# Patient Record
Sex: Female | Born: 1995
Health system: Southern US, Community
[De-identification: ages and names within clinical notes are randomized; demographics above are authoritative.]

## PROBLEM LIST (undated history)

## (undated) DIAGNOSIS — G43909 Migraine, unspecified, not intractable, without status migrainosus: Secondary | ICD-10-CM

## (undated) DIAGNOSIS — N200 Calculus of kidney: Secondary | ICD-10-CM

## (undated) DIAGNOSIS — F4320 Adjustment disorder, unspecified: Secondary | ICD-10-CM

## (undated) DIAGNOSIS — E669 Obesity, unspecified: Secondary | ICD-10-CM

## (undated) DIAGNOSIS — T7840XA Allergy, unspecified, initial encounter: Secondary | ICD-10-CM

## (undated) DIAGNOSIS — R1310 Dysphagia, unspecified: Secondary | ICD-10-CM

## (undated) HISTORY — DX: Dysphagia, unspecified: R13.10

## (undated) HISTORY — DX: Obesity, unspecified: E66.9

## (undated) HISTORY — DX: Calculus of kidney: N20.0

## (undated) HISTORY — PX: HX NO SURGICAL PROCEDURES: 2100001501

## (undated) HISTORY — DX: Allergy, unspecified, initial encounter: T78.40XA

## (undated) HISTORY — DX: Adjustment disorder, unspecified: F43.20

---
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
## 2019-08-21 ENCOUNTER — Encounter (HOSPITAL_COMMUNITY): Payer: Self-pay | Admitting: Emergency Medicine

## 2019-08-21 ENCOUNTER — Other Ambulatory Visit: Payer: Self-pay

## 2019-08-21 ENCOUNTER — Emergency Department (HOSPITAL_COMMUNITY)
Admission: EM | Admit: 2019-08-21 | Discharge: 2019-08-21 | Discharge status: Against Medical Advice | Payer: BC Managed Care – PPO | Attending: Emergency Medicine | Admitting: Emergency Medicine

## 2019-08-21 DIAGNOSIS — R519 Headache, unspecified: Secondary | ICD-10-CM | POA: Diagnosis not present

## 2019-08-21 DIAGNOSIS — G43411 Hemiplegic migraine, intractable, with status migrainosus: Secondary | ICD-10-CM | POA: Diagnosis not present

## 2019-08-21 HISTORY — DX: Migraine, unspecified, not intractable, without status migrainosus: G43.909

## 2019-08-21 LAB — CBC WITH DIFFERENTIAL/PLATELET
Abs Immature Granulocytes: 0.05 10*3/uL (ref 0.00–0.07)
Basophils Absolute: 0 10*3/uL (ref 0.0–0.1)
Basophils Relative: 0 %
Eosinophils Absolute: 0 10*3/uL (ref 0.0–0.5)
Eosinophils Relative: 0 %
HCT: 47 % — ABNORMAL HIGH (ref 36.0–46.0)
Hemoglobin: 15.9 g/dL — ABNORMAL HIGH (ref 12.0–15.0)
Immature Granulocytes: 0 %
Lymphocytes Relative: 8 %
Lymphs Abs: 1.2 10*3/uL (ref 0.7–4.0)
MCH: 28.6 pg (ref 26.0–34.0)
MCHC: 33.8 g/dL (ref 30.0–36.0)
MCV: 84.7 fL (ref 80.0–100.0)
Monocytes Absolute: 0.6 10*3/uL (ref 0.1–1.0)
Monocytes Relative: 4 %
Neutro Abs: 13.7 10*3/uL — ABNORMAL HIGH (ref 1.7–7.7)
Neutrophils Relative %: 88 %
Platelets: 291 10*3/uL (ref 150–400)
RBC: 5.55 MIL/uL — ABNORMAL HIGH (ref 3.87–5.11)
RDW: 12.1 % (ref 11.5–15.5)
WBC: 15.6 10*3/uL — ABNORMAL HIGH (ref 4.0–10.5)
nRBC: 0 % (ref 0.0–0.2)

## 2019-08-21 LAB — BASIC METABOLIC PANEL
Anion gap: 14 (ref 5–15)
BUN: 11 mg/dL (ref 6–20)
CO2: 17 mmol/L — ABNORMAL LOW (ref 22–32)
Calcium: 9.6 mg/dL (ref 8.9–10.3)
Chloride: 106 mmol/L (ref 98–111)
Creatinine, Ser: 0.75 mg/dL (ref 0.44–1.00)
GFR calc Af Amer: >60 mL/min (ref >60)
GFR calc non Af Amer: >60 mL/min (ref >60)
Glucose, Bld: 117 mg/dL — ABNORMAL HIGH (ref 70–99)
Potassium: 4 mmol/L (ref 3.5–5.1)
Sodium: 137 mmol/L (ref 135–145)

## 2019-08-21 LAB — I-STAT BETA HCG BLOOD, ED (MC, WL, AP ONLY): I-stat hCG, quantitative: <5 m[IU]/mL (ref <5)

## 2019-08-21 MED ORDER — SODIUM CHLORIDE 0.9 % IV BOLUS
1000.0000 mL | Freq: Once | INTRAVENOUS | Status: AC
  Administered 2019-08-21: 17:00:00 1000 mL via INTRAVENOUS

## 2019-08-21 MED ORDER — METOCLOPRAMIDE HCL 5 MG/ML IJ SOLN
10.0000 mg | Freq: Once | INTRAMUSCULAR | Status: AC
  Administered 2019-08-21: 10 mg via INTRAVENOUS
  Filled 2019-08-21: qty 2

## 2019-08-21 MED ORDER — DIPHENHYDRAMINE HCL 50 MG/ML IJ SOLN
25.0000 mg | Freq: Once | INTRAMUSCULAR | Status: AC
  Administered 2019-08-21: 25 mg via INTRAVENOUS
  Filled 2019-08-21: qty 1

## 2019-08-21 MED ORDER — ONDANSETRON 4 MG PO TBDP
4.0000 mg | ORAL_TABLET | Freq: Once | ORAL | Status: AC | PRN
  Administered 2019-08-21: 4 mg via ORAL
  Filled 2019-08-21: qty 1

## 2019-08-21 NOTE — ED Triage Notes (Signed)
Pt here POV with a migraine that started 0930 this morning and has had N/V since onset of HA. Pt states that she has taken MagSalt denies taking anything for the N/V.

## 2019-08-21 NOTE — Discharge Instructions (Signed)
Return for new or worsening symptoms

## 2019-08-21 NOTE — ED Provider Notes (Signed)
[facility] EMERGENCY DEPARTMENT Provider Note   CSN: [id number] Arrival date & time: 08/21/19  1606    History Chief Complaint  Patient presents with  . Migraine    Cheryl Perez is a 24 y.o. female with past medical history significant for migraine who presents for evaluation of headache. Patient states headache started at 930 this morning.  Has history of migraines however has not had 1 over the last 2 years.  She states she initially felt like she had some weakness to her right upper extremity.  She was seen in urgent care.  Patient states this has resolved however now feels like she has some weakness in her left upper extremity.  She admits to photophobia without phonophobia.  Multiple episodes of NBNB emesis.  States this feels similar to her prior migraines.  Denies recent head trauma, head injury, lightheadedness, dizziness, neck pain, neck stiffness, chest pain, shortness of breath, abdominal pain, diarrhea, dysuria.  She denies any paresthesias, difficulty with word finding.  Rates her pain a 10/10.  She denies any sudden onset thunderclap headache.  Denies eye pain.  Denies additional aggravating or alleviating factors.  History obtained from patient and past medical records. No interpretor was used.  HPI     Past Medical History:  Diagnosis Date  . Migraine     There are no problems to display for this patient.   History reviewed. No pertinent surgical history.   OB History   No obstetric history on file.     No family history on file.  Social History   Tobacco Use  . Smoking status: Not on file  Substance Use Topics  . Alcohol use: Not on file  . Drug use: Not on file    Home Medications Prior to Admission medications   Not on File    Allergies    Patient has no active allergies.  Review of Systems   Review of Systems  Constitutional: Negative.   HENT: Negative.   Eyes: Positive for photophobia. Negative for visual disturbance.   Respiratory: Negative.   Cardiovascular: Negative.   Gastrointestinal: Negative.   Genitourinary: Negative.   Musculoskeletal: Negative.   Skin: Negative.   Neurological: Positive for weakness (Initially RUE, resolved and now LUE) and headaches. Negative for dizziness, tremors, seizures, syncope, facial asymmetry, speech difficulty, light-headedness and numbness.  All other systems reviewed and are negative.   Physical Exam Updated Vital Signs BP 128/71 (BP Location: Left Arm)   Pulse 90   Temp 98 F (36.7 C) (Oral)   Resp 20   Ht 5\' 3"  (1.6 m)   Wt 99.8 kg   SpO2 100%   BMI 38.97 kg/m   Physical Exam Physical Exam  Constitutional: Pt is oriented to person, place, and time. Pt appears well-developed and well-nourished. No distress.  HENT:  Head: Normocephalic and atraumatic.  Mouth/Throat: Oropharynx is clear and moist.  Eyes: Conjunctivae and EOM are normal. Pupils are equal, round, and reactive to light. No scleral icterus.  No horizontal, vertical or rotational nystagmus  Neck: Normal range of motion. Neck supple.  Full active and passive ROM without pain No midline or paraspinal tenderness No nuchal rigidity or meningeal signs  Cardiovascular: Normal rate, regular rhythm and intact distal pulses.   Pulmonary/Chest: Effort normal and breath sounds normal. No respiratory distress. Pt has no wheezes. No rales.  Abdominal: Soft. Bowel sounds are normal. There is no tenderness. There is no rebound and no guarding.  Musculoskeletal: Normal range of  motion.  Lymphadenopathy:    No cervical adenopathy.  Neurological: Pt. is alert and oriented to person, place, and time. He has normal reflexes. No cranial nerve deficit.  Exhibits normal muscle tone. Coordination normal.  Mental Status:  Alert, oriented, thought content appropriate. Speech fluent without evidence of aphasia. Able to follow 2 step commands without difficulty.  Cranial Nerves:  II:  Peripheral visual fields  grossly normal, pupils equal, round, reactive to light III,IV, VI: ptosis not present, extra-ocular motions intact bilaterally  V,VII: smile symmetric, facial light touch sensation equal VIII: hearing grossly normal bilaterally  IX,X: midline uvula rise  XI: bilateral shoulder shrug equal and strong XII: midline tongue extension  Motor:  5/5 in upper and lower extremities bilaterally including strong and equal grip strength and dorsiflexion/plantar flexion Sensory: Pinprick and light touch normal in all extremities.  Deep Tendon Reflexes: 2+ and symmetric  Cerebellar: normal finger-to-nose with bilateral upper extremities however slow to BL finger to nose Gait: normal gait and balance CV: distal pulses palpable throughout   Skin: Skin is warm and dry. No rash noted. Pt is not diaphoretic.  Psychiatric: Pt has a normal mood and affect. Behavior is normal. Judgment and thought content normal.  Nursing note and vitals reviewed. ED Results / Procedures / Treatments   Labs (all labs ordered are listed, but only abnormal results are displayed) Labs Reviewed  CBC WITH DIFFERENTIAL/PLATELET - Abnormal; Notable for the following components:      Result Value   WBC 15.6 (*)    RBC 5.55 (*)    Hemoglobin 15.9 (*)    HCT 47.0 (*)    Neutro Abs 13.7 (*)    All other components within normal limits  BASIC METABOLIC PANEL - Abnormal; Notable for the following components:   CO2 17 (*)    Glucose, Bld 117 (*)    All other components within normal limits  I-STAT BETA HCG BLOOD, ED ([facility], [facility], [facility] ONLY)    EKG None  Radiology No results found.  Procedures Procedures (including critical care time)  Medications Ordered in ED Medications  ondansetron (ZOFRAN-ODT) disintegrating tablet 4 mg (4 mg Oral Given 08/21/19 1621)  sodium chloride 0.9 % bolus 1,000 mL (1,000 mLs Intravenous Bolus from Bag 08/21/19 1708)  metoCLOPramide (REGLAN) injection 10 mg (10 mg Intravenous Given 08/21/19 1709)   diphenhydrAMINE (BENADRYL) injection 25 mg (25 mg Intravenous Given 08/21/19 1709)   ED Course  I have reviewed the triage vital signs and the nursing notes.  Pertinent labs & imaging results that were available during my care of the patient were reviewed by me and considered in my medical decision making (see chart for details).  24 year old female presents for evaluation of migraine.  History of migraine.  She is afebrile, nonseptic, not ill-appearing.  Began at 9:30 AM this morning however denies sudden onset thunderclap headache, trauma.  Patient states initially she had some right upper extremity weakness.  Was seen by urgent care and referred here to the emergency department.  Patient states this has resolved however she now feels like she has weakness in her left upper extremity.  Multiple episodes of NBNB emesis.  She has a nonfocal neuro exam without deficits except she does have some slow finger-to-nose bilaterally.  Given subjective weakness will obtain CT head.  She has no neck stiffness or neck rigidity.  Will also give migraine cocktail.  1830: Nursing has informed me that patient is wanting to leave.  States her headache has improved.  Discussed with patient concerning symptoms at urgent care as well as here in the emergency department.  Patient states she does not want any imaging obtained.  Appears to have the capacity to make medical decisions.  Discussed risk versus benefit of leaving the emergency department.  Patient voiced understanding has chosen to leave [place].  We discussed the nature and purpose, risks and benefits, as well as, the alternatives of treatment. Time was given to allow the opportunity to ask questions and consider their options, and after the discussion, the patient decided to refuse the offerred treatment. The patient was informed that refusal could lead to, but was not limited to, death, permanent disability, or severe pain. If present, I asked the  relatives or significant others to dissuade them without success. Prior to refusing, I determined that the patient had the capacity to make their decision and understood the consequences of that decision. After refusal, I made every reasonable opportunity to treat them to the best of my ability.  The patient was notified that they may return to the emergency department at any time for further treatment.       MDM Rules/Calculators/A&P                       Final Clinical Impression(s) / ED Diagnoses Final diagnoses:  Acute nonintractable headache, unspecified headache type    Rx / DC Orders ED Discharge Orders    None       Cantrell Martus A, PA-C 08/21/19 1835    Margette Fast, MD 08/21/19 [phone]

## 2019-10-15 DIAGNOSIS — N3001 Acute cystitis with hematuria: Secondary | ICD-10-CM | POA: Diagnosis not present

## 2019-12-04 DIAGNOSIS — Z020 Encounter for examination for admission to educational institution: Secondary | ICD-10-CM | POA: Diagnosis not present

## 2019-12-04 DIAGNOSIS — Z111 Encounter for screening for respiratory tuberculosis: Secondary | ICD-10-CM | POA: Diagnosis not present

## 2020-01-19 DIAGNOSIS — R110 Nausea: Secondary | ICD-10-CM | POA: Diagnosis not present

## 2020-01-19 DIAGNOSIS — R1110 Vomiting, unspecified: Secondary | ICD-10-CM | POA: Diagnosis not present

## 2020-01-19 DIAGNOSIS — R519 Headache, unspecified: Secondary | ICD-10-CM | POA: Diagnosis not present

## 2020-01-19 DIAGNOSIS — Z20822 Contact with and (suspected) exposure to covid-19: Secondary | ICD-10-CM | POA: Diagnosis not present

## 2020-03-16 DIAGNOSIS — B9689 Other specified bacterial agents as the cause of diseases classified elsewhere: Secondary | ICD-10-CM | POA: Diagnosis not present

## 2020-03-16 DIAGNOSIS — J029 Acute pharyngitis, unspecified: Secondary | ICD-10-CM | POA: Diagnosis not present

## 2020-03-16 DIAGNOSIS — J208 Acute bronchitis due to other specified organisms: Secondary | ICD-10-CM | POA: Diagnosis not present

## 2020-03-16 DIAGNOSIS — R059 Cough, unspecified: Secondary | ICD-10-CM | POA: Diagnosis not present

## 2020-05-05 DIAGNOSIS — R109 Unspecified abdominal pain: Secondary | ICD-10-CM | POA: Diagnosis not present

## 2020-05-05 DIAGNOSIS — R399 Unspecified symptoms and signs involving the genitourinary system: Secondary | ICD-10-CM | POA: Diagnosis not present

## 2020-05-06 ENCOUNTER — Other Ambulatory Visit: Payer: Self-pay | Admitting: Family Medicine

## 2020-05-06 DIAGNOSIS — R1084 Generalized abdominal pain: Secondary | ICD-10-CM

## 2020-05-14 ENCOUNTER — Encounter (HOSPITAL_COMMUNITY): Payer: Self-pay

## 2020-05-14 ENCOUNTER — Emergency Department (HOSPITAL_COMMUNITY)
Admission: EM | Admit: 2020-05-14 | Discharge: 2020-05-15 | Disposition: A | Discharge status: Home / Self Care | Payer: BC Managed Care – PPO | Attending: Emergency Medicine | Admitting: Emergency Medicine

## 2020-05-14 ENCOUNTER — Other Ambulatory Visit: Payer: Self-pay

## 2020-05-14 DIAGNOSIS — R109 Unspecified abdominal pain: Secondary | ICD-10-CM

## 2020-05-14 DIAGNOSIS — R1032 Left lower quadrant pain: Secondary | ICD-10-CM | POA: Insufficient documentation

## 2020-05-14 DIAGNOSIS — R112 Nausea with vomiting, unspecified: Secondary | ICD-10-CM | POA: Insufficient documentation

## 2020-05-14 DIAGNOSIS — R1011 Right upper quadrant pain: Secondary | ICD-10-CM | POA: Insufficient documentation

## 2020-05-14 DIAGNOSIS — M545 Low back pain, unspecified: Secondary | ICD-10-CM | POA: Diagnosis not present

## 2020-05-14 DIAGNOSIS — R1031 Right lower quadrant pain: Secondary | ICD-10-CM | POA: Diagnosis not present

## 2020-05-14 DIAGNOSIS — N939 Abnormal uterine and vaginal bleeding, unspecified: Secondary | ICD-10-CM | POA: Insufficient documentation

## 2020-05-14 DIAGNOSIS — R100 Acute abdomen: Secondary | ICD-10-CM | POA: Diagnosis not present

## 2020-05-14 LAB — I-STAT BETA HCG BLOOD, ED (MC, WL, AP ONLY): I-stat hCG, quantitative: <5 m[IU]/mL (ref <5)

## 2020-05-14 LAB — CBC
HCT: 44.7 % (ref 36.0–46.0)
Hemoglobin: 15.3 g/dL — ABNORMAL HIGH (ref 12.0–15.0)
MCH: 29 pg (ref 26.0–34.0)
MCHC: 34.2 g/dL (ref 30.0–36.0)
MCV: 84.7 fL (ref 80.0–100.0)
Platelets: 274 10*3/uL (ref 150–400)
RBC: 5.28 MIL/uL — ABNORMAL HIGH (ref 3.87–5.11)
RDW: 12.2 % (ref 11.5–15.5)
WBC: 7.4 10*3/uL (ref 4.0–10.5)
nRBC: 0 % (ref 0.0–0.2)

## 2020-05-14 LAB — COMPREHENSIVE METABOLIC PANEL
ALT: 27 U/L (ref 0–44)
AST: 18 U/L (ref 15–41)
Albumin: 4.4 g/dL (ref 3.5–5.0)
Alkaline Phosphatase: 54 U/L (ref 38–126)
Anion gap: 10 (ref 5–15)
BUN: 11 mg/dL (ref 6–20)
CO2: 24 mmol/L (ref 22–32)
Calcium: 9.5 mg/dL (ref 8.9–10.3)
Chloride: 106 mmol/L (ref 98–111)
Creatinine, Ser: 0.89 mg/dL (ref 0.44–1.00)
GFR, Estimated: >60 mL/min (ref >60)
Glucose, Bld: 90 mg/dL (ref 70–99)
Potassium: 4 mmol/L (ref 3.5–5.1)
Sodium: 140 mmol/L (ref 135–145)
Total Bilirubin: 0.7 mg/dL (ref 0.3–1.2)
Total Protein: 7.2 g/dL (ref 6.5–8.1)

## 2020-05-14 LAB — URINALYSIS, ROUTINE W REFLEX MICROSCOPIC
Bilirubin Urine: NEGATIVE
Glucose, UA: NEGATIVE mg/dL
Ketones, ur: NEGATIVE mg/dL
Nitrite: NEGATIVE
Protein, ur: NEGATIVE mg/dL
Specific Gravity, Urine: 1.024 (ref 1.005–1.030)
pH: 5 (ref 5.0–8.0)

## 2020-05-14 LAB — LIPASE, BLOOD: Lipase: 54 U/L — ABNORMAL HIGH (ref 11–51)

## 2020-05-14 NOTE — ED Triage Notes (Signed)
Pt came in c/o of abd pain. Pt started having sudden lower back pain and abd pain that started after she woke up from her nap. Pt went to [facility] and was seen there but referred here to be seen. Pt states she has been having vaginal bleeding for 3 days, with last menstruation 2 weeks ago. Pt states she was negative on her pregnancy test last week.

## 2020-05-15 ENCOUNTER — Emergency Department (HOSPITAL_COMMUNITY): Payer: BC Managed Care – PPO

## 2020-05-15 LAB — WET PREP, GENITAL
Clue Cells Wet Prep HPF POC: NONE SEEN
Sperm: NONE SEEN
Trich, Wet Prep: NONE SEEN
Yeast Wet Prep HPF POC: NONE SEEN

## 2020-05-15 MED ORDER — IOHEXOL 300 MG/ML  SOLN
100.0000 mL | Freq: Once | INTRAMUSCULAR | Status: AC | PRN
  Administered 2020-05-15: 100 mL via INTRAVENOUS

## 2020-05-15 MED ORDER — ONDANSETRON HCL 4 MG/2ML IJ SOLN
4.0000 mg | Freq: Once | INTRAMUSCULAR | Status: AC
  Administered 2020-05-15: 4 mg via INTRAVENOUS
  Filled 2020-05-15: qty 2

## 2020-05-15 MED ORDER — SODIUM CHLORIDE 0.9 % IV BOLUS
1000.0000 mL | Freq: Once | INTRAVENOUS | Status: AC
  Administered 2020-05-15: 1000 mL via INTRAVENOUS

## 2020-05-15 MED ORDER — MORPHINE SULFATE (PF) 2 MG/ML IV SOLN
2.0000 mg | Freq: Once | INTRAVENOUS | Status: AC
  Administered 2020-05-15: 2 mg via INTRAVENOUS
  Filled 2020-05-15: qty 1

## 2020-05-15 NOTE — ED Provider Notes (Signed)
[facility] EMERGENCY DEPARTMENT Provider Note   CSN: [id number] Arrival date & time: 05/14/20  2051     History Chief Complaint  Patient presents with  . Abdominal Pain    Cheryl Perez is a 24 y.o. female.  HPI   Patient is a 24 year old female with a medical history as noted below.  She presents today due to abdominal pain.  She states her symptoms started about 1 week ago.  Waxing and waning.  She was initially evaluated by her PCP and given Zofran to use as needed for nausea and vomiting.  She has been using this with minimal relief and notes current nausea and vomiting.  She states that her symptoms typically worsen with food.  She has been able to eat soft foods such as mashed potatoes but otherwise cannot tolerate most foods and will then vomit.  Additionally notes some mild vaginal bleeding about 3 days ago.  Her last menstrual cycle was 2 weeks ago.  She feels that the bleeding has decreased and now only notices it when urinating as well as when wiping after urinating.  She is sexually active with one female partner.  She states that they always use condoms for protection.  No vaginal discharge.  No dysuria.  No chest pain, shortness of breath, leg swelling.  Patient states that she was initially evaluated by her primary care provider, Dr. Hyman Hopes.  Ultrasound was scheduled for December 20.  States that she has been dealing with this type of abdominal pain for many years.  Typically not this severe.  Has never been seen by a gastroenterologist.     Past Medical History:  Diagnosis Date  . Migraine     There are no problems to display for this patient.   History reviewed. No pertinent surgical history.   OB History   No obstetric history on file.     No family history on file.     Home Medications Prior to Admission medications   Not on File    Allergies    Patient has no active allergies.  Review of Systems   Review of Systems  All other systems  reviewed and are negative. Ten systems reviewed and are negative for acute change, except as noted in the HPI.   Physical Exam Updated Vital Signs BP 116/67 (BP Location: Right Arm)   Pulse 68   Temp 98 F (36.7 C)   Resp 17   Ht 5\' 3"  (1.6 m)   Wt 109.8 kg   SpO2 100%   BMI 42.87 kg/m   Physical Exam Vitals and nursing note reviewed.  Constitutional:      General: She is not in acute distress.    Appearance: Normal appearance. She is well-developed. She is not ill-appearing, toxic-appearing or diaphoretic.  HENT:     Head: Normocephalic and atraumatic.     Right Ear: External ear normal.     Left Ear: External ear normal.     Nose: Nose normal.     Mouth/Throat:     Pharynx: Oropharynx is clear. No oropharyngeal exudate or posterior oropharyngeal erythema.     Comments: Dry mucous membranes Eyes:     Extraocular Movements: Extraocular movements intact.  Cardiovascular:     Rate and Rhythm: Normal rate and regular rhythm.     Pulses: Normal pulses.     Heart sounds: Normal heart sounds. No murmur heard. No friction rub. No gallop.   Pulmonary:     Effort: Pulmonary effort  is normal. No respiratory distress.     Breath sounds: Normal breath sounds. No stridor. No wheezing, rhonchi or rales.  Abdominal:     General: Abdomen is flat and protuberant. There is no distension.     Palpations: Abdomen is soft.     Tenderness: There is generalized abdominal tenderness.     Comments: Protuberant abdomen that is soft.  Generalized abdominal pain with deep palpation that seems to be worst in the right upper quadrant as well as left lower quadrant.  Genitourinary:    Comments: Normal-appearing vulvar anatomy.  Normal-appearing vaginal mucosa.  Closed cervical os.  Small amount of brown discharge noted in the vaginal vault consistent with likely aged blood.  No foreign bodies or increased friability.  No cervical motion tenderness.  No adnexal tenderness.  Female nursing chaperone  present throughout the exam. Musculoskeletal:        General: Normal range of motion.     Cervical back: Normal range of motion and neck supple. No tenderness.  Skin:    General: Skin is warm and dry.  Neurological:     General: No focal deficit present.     Mental Status: She is alert and oriented to person, place, and time.  Psychiatric:        Mood and Affect: Mood normal.        Behavior: Behavior normal.    ED Results / Procedures / Treatments   Labs (all labs ordered are listed, but only abnormal results are displayed) Labs Reviewed  WET PREP, GENITAL - Abnormal; Notable for the following components:      Result Value   WBC, Wet Prep HPF POC MANY (*)    All other components within normal limits  LIPASE, BLOOD - Abnormal; Notable for the following components:   Lipase 54 (*)    All other components within normal limits  CBC - Abnormal; Notable for the following components:   RBC 5.28 (*)    Hemoglobin 15.3 (*)    All other components within normal limits  URINALYSIS, ROUTINE W REFLEX MICROSCOPIC - Abnormal; Notable for the following components:   APPearance HAZY (*)    Hgb urine dipstick LARGE (*)    Leukocytes,Ua TRACE (*)    Bacteria, UA RARE (*)    All other components within normal limits  COMPREHENSIVE METABOLIC PANEL  I-STAT BETA HCG BLOOD, ED ([facility], [facility], [facility] ONLY)  GC/CHLAMYDIA PROBE AMP ([place]) NOT AT [facility]   EKG None  Radiology CT ABDOMEN PELVIS W CONTRAST  Result Date: 05/15/2020 CLINICAL DATA:  24 year old female with left lower quadrant abdominal pain. EXAM: CT ABDOMEN WITH CONTRAST TECHNIQUE: Multidetector CT imaging of the abdomen was performed using the standard protocol following bolus administration of intravenous contrast. CONTRAST:  OMNIPAQUE IOHEXOL 300 MG/ML  SOLN COMPARISON:  None. FINDINGS: Lower chest: The lung bases are clear. The heart is normal in size. No pericardial effusion. Hepatobiliary: No focal liver abnormality is seen. No  gallstones, gallbladder wall thickening, or biliary dilatation. Pancreas: Unremarkable. No pancreatic ductal dilatation or surrounding inflammatory changes. Spleen: Normal in size without focal abnormality. A splenule is noted in the hilum. Adrenals/Urinary Tract: Adrenal glands are unremarkable. Kidneys are normal, without renal calculi, focal lesion, or hydronephrosis. Bladder is unremarkable. Stomach/Bowel: Stomach is within normal limits. Appendix appears normal. No evidence of bowel wall thickening, distention, or inflammatory changes. Vascular/Lymphatic: No significant vascular findings are present. No enlarged abdominal or pelvic lymph nodes. Reproductive: The uterus is present unremarkable. No evidence  of adnexal mass. Other: No abdominal wall hernia or abnormality. No abdominopelvic ascites. 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 No acute osseous abnormality. IMPRESSION: No acute abnormality in the abdomen or pelvis. Electronically Signed   By: Marliss Coots MD   On: 05/15/2020 10:53   Procedures Procedures (including critical care time)  Medications Ordered in ED Medications  sodium chloride 0.9 % bolus 1,000 mL (1,000 mLs Intravenous New Bag/Given 05/15/20 0951)  ondansetron (ZOFRAN) injection 4 mg (4 mg Intravenous Given 05/15/20 0951)  morphine 2 MG/ML injection 2 mg (2 mg Intravenous Given 05/15/20 0952)  iohexol (OMNIPAQUE) 300 MG/ML solution 100 mL (100 mLs Intravenous Contrast Given 05/15/20 1030)   ED Course  I have reviewed the triage vital signs and the nursing notes.  Pertinent labs & imaging results that were available during my care of the patient were reviewed by me and considered in my medical decision making (see chart for details).  Clinical Course as of 05/15/20 1125  Sat May 15, 2020  1112 CT ABDOMEN PELVIS W CONTRAST No acute abnormalities noted in the abdomen or pelvis. [LJ]    Clinical Course User Index [LJ]  Placido Sou, PA-C   MDM Rules/Calculators/A&P                          Patient is a 24 year old female that presents today with acute on chronic abdominal pain.  Patient reports a history of similar symptoms for many years but states that her current abdominal pain appears to be worse than normal.  She also reports vaginal bleeding that started a few days ago and has since alleviated.  Patient had diffuse abdominal pain on my exam that appeared to be worst in the right upper quadrant as well as left lower quadrant.  CT scan was obtained of the abdomen which was negative for any intra-abdominal or intrapelvic acute abnormalities.  Pelvic exam was generally reassuring.  There was a small amount of brown discharge noted in the vaginal vault but think this is likely old blood.  Wet prep resulted showing many white blood cells but otherwise no abnormalities.  There is no CMT or adnexal tenderness.  Doubt PID or torsion at this time.  Patient in a monogamous relationship and not concerned for STI this time.  She is going to check the results of her gonorrhea/chlamydia test on MyChart.  Will return for treatment if either is positive.  Remaining labs generally reassuring.  CBC without leukocytosis.  Elevated RBCs and hemoglobin, likely secondary to hemoconcentration from poor p.o. intake.  Very mildly elevated lipase of 54.  CMP within normal limits.  No elevation in i-STAT beta-hCG.  Patient states that she has Zofran at home does not need any additional medications.  Feel that given her chronic symptoms she likely needs gastroenterology referral.  We will send her to [facility] gastroenterology since she is already followed by primary care with [facility].  Patient given strict return precautions and knows to return to the ER with any new or worsening symptoms.  Her questions were answered and she was amicable at the time of discharge.  Final Clinical Impression(s) / ED Diagnoses Final diagnoses:  Abdominal  pain, unspecified abdominal location   Rx / DC Orders ED Discharge Orders    None       Placido Sou, PA-C 05/15/20 1218    Maia Plan, MD 05/16/20 [phone]

## 2020-05-15 NOTE — Discharge Instructions (Signed)
Like we discussed, please follow-up with [facility] gastroenterology.  I would recommend scheduling a follow-up visit with them for reevaluation.  You can continue to take Zofran as needed for breakthrough nausea and vomiting that you cannot control.  If your symptoms worsen, you need to return to the emergency department for reevaluation.  It was a pleasure to meet you.

## 2020-05-17 LAB — GC/CHLAMYDIA PROBE AMP (~~LOC~~) NOT AT ARMC
Chlamydia: NEGATIVE
Comment: NEGATIVE
Comment: NORMAL
Neisseria Gonorrhea: NEGATIVE

## 2020-05-24 ENCOUNTER — Ambulatory Visit
Admission: RE | Admit: 2020-05-24 | Discharge: 2020-05-24 | Disposition: A | Discharge status: Home / Self Care | Payer: BC Managed Care – PPO | Source: Ambulatory Visit | Attending: Family Medicine | Admitting: Family Medicine

## 2020-05-24 DIAGNOSIS — R1084 Generalized abdominal pain: Secondary | ICD-10-CM

## 2020-05-24 DIAGNOSIS — K7689 Other specified diseases of liver: Secondary | ICD-10-CM | POA: Diagnosis not present

## 2020-07-20 ENCOUNTER — Encounter (INDEPENDENT_AMBULATORY_CARE_PROVIDER_SITE_OTHER): Payer: Self-pay

## 2020-10-07 DIAGNOSIS — R1031 Right lower quadrant pain: Secondary | ICD-10-CM | POA: Diagnosis not present

## 2020-10-07 DIAGNOSIS — R112 Nausea with vomiting, unspecified: Secondary | ICD-10-CM | POA: Diagnosis not present

## 2020-10-07 DIAGNOSIS — R194 Change in bowel habit: Secondary | ICD-10-CM | POA: Diagnosis not present

## 2020-10-07 DIAGNOSIS — R197 Diarrhea, unspecified: Secondary | ICD-10-CM | POA: Diagnosis not present

## 2021-01-03 DIAGNOSIS — X500XXA Overexertion from strenuous movement or load, initial encounter: Secondary | ICD-10-CM | POA: Diagnosis not present

## 2021-01-03 DIAGNOSIS — M5442 Lumbago with sciatica, left side: Secondary | ICD-10-CM | POA: Diagnosis not present

## 2021-01-03 DIAGNOSIS — M545 Low back pain, unspecified: Secondary | ICD-10-CM | POA: Insufficient documentation

## 2021-01-03 DIAGNOSIS — M5441 Lumbago with sciatica, right side: Secondary | ICD-10-CM | POA: Diagnosis not present

## 2021-01-04 ENCOUNTER — Emergency Department (HOSPITAL_COMMUNITY): Payer: 59

## 2021-01-04 ENCOUNTER — Other Ambulatory Visit: Payer: Self-pay

## 2021-01-04 ENCOUNTER — Emergency Department (HOSPITAL_COMMUNITY)
Admission: EM | Admit: 2021-01-04 | Discharge: 2021-01-04 | Disposition: A | Discharge status: Home / Self Care | Payer: 59 | Attending: Emergency Medicine | Admitting: Emergency Medicine

## 2021-01-04 DIAGNOSIS — M545 Low back pain, unspecified: Secondary | ICD-10-CM | POA: Diagnosis not present

## 2021-01-04 DIAGNOSIS — M5442 Lumbago with sciatica, left side: Secondary | ICD-10-CM | POA: Diagnosis not present

## 2021-01-04 DIAGNOSIS — M5441 Lumbago with sciatica, right side: Secondary | ICD-10-CM | POA: Diagnosis not present

## 2021-01-04 LAB — URINALYSIS, ROUTINE W REFLEX MICROSCOPIC
Bilirubin Urine: NEGATIVE
Glucose, UA: NEGATIVE mg/dL
Ketones, ur: NEGATIVE mg/dL
Leukocytes,Ua: NEGATIVE
Nitrite: NEGATIVE
Protein, ur: NEGATIVE mg/dL
Specific Gravity, Urine: >1.03 — ABNORMAL HIGH (ref 1.005–1.030)
pH: 5.5 (ref 5.0–8.0)

## 2021-01-04 LAB — URINALYSIS, MICROSCOPIC (REFLEX)

## 2021-01-04 LAB — PREGNANCY, URINE: Preg Test, Ur: NEGATIVE

## 2021-01-04 MED ORDER — METHOCARBAMOL 500 MG PO TABS
500.0000 mg | ORAL_TABLET | Freq: Once | ORAL | Status: AC
  Administered 2021-01-04: 500 mg via ORAL
  Filled 2021-01-04: qty 1

## 2021-01-04 MED ORDER — METHYLPREDNISOLONE 4 MG PO TBPK: ORAL_TABLET | ORAL | 0 refills | Status: DC

## 2021-01-04 MED ORDER — OXYCODONE-ACETAMINOPHEN 5-325 MG PO TABS
2.0000 | ORAL_TABLET | Freq: Once | ORAL | Status: AC
  Administered 2021-01-04: 2 via ORAL
  Filled 2021-01-04: qty 2

## 2021-01-04 MED ORDER — KETOROLAC TROMETHAMINE 60 MG/2ML IM SOLN
60.0000 mg | Freq: Once | INTRAMUSCULAR | Status: AC
  Administered 2021-01-04: 60 mg via INTRAMUSCULAR
  Filled 2021-01-04: qty 2

## 2021-01-04 MED ORDER — HYDROCODONE-ACETAMINOPHEN 5-325 MG PO TABS: 1.0000 | ORAL_TABLET | Freq: Four times a day (QID) | ORAL | 0 refills | Status: DC | PRN

## 2021-01-04 MED ORDER — METHOCARBAMOL 500 MG PO TABS: 500.0000 mg | ORAL_TABLET | Freq: Four times a day (QID) | ORAL | 0 refills | Status: DC | PRN

## 2021-01-04 MED ORDER — PREDNISONE 20 MG PO TABS
60.0000 mg | ORAL_TABLET | Freq: Once | ORAL | Status: AC
  Administered 2021-01-04: 60 mg via ORAL
  Filled 2021-01-04: qty 3

## 2021-01-04 MED ORDER — HYDROMORPHONE HCL 1 MG/ML IJ SOLN
1.0000 mg | Freq: Once | INTRAMUSCULAR | Status: AC
  Administered 2021-01-04: 1 mg via INTRAMUSCULAR
  Filled 2021-01-04: qty 1

## 2021-01-04 MED ORDER — METHOCARBAMOL 500 MG PO TABS
750.0000 mg | ORAL_TABLET | Freq: Once | ORAL | Status: AC
  Administered 2021-01-04: 750 mg via ORAL
  Filled 2021-01-04: qty 2

## 2021-01-04 NOTE — Discharge Instructions (Addendum)
1.  You were given a dose of prednisone in the emergency department.  Start your Solu-Medrol Dosepak tomorrow morning.  Follow Dosepak instructions. 2.  You do not need to take additional anti-inflammatory medications called NSAIDs while you are being treated with steroids.  NSAIDs include medication such as ibuprofen, Motrin, Aleve, naproxen, Celebrex.  You may take Tylenol (acetaminophen) in addition to your steroid pack.  Tylenol can be used alone or in combination with 1 Vicodin tablet.  If you take 2 Vicodin tablets, do not take an additional dose of Tylenol (each Vicodin tablet contains 325 mg of acetaminophen, the equivalent of 1 regular strength Tylenol tablet).  As soon as you can decrease your use of Vicodin and solely use Tylenol and Robaxin for pain control, proceed with that management.  Narcotic pain medications are highly addictive and very difficult to stop using once addicted. 3.  Schedule follow-up appointment with [facility].  Return to the emergency department if you get numbness or weakness anterior legs, decreased sensation in your genital area, difficulty with bowel or bladder control or movements

## 2021-01-04 NOTE — ED Provider Notes (Signed)
Emergency Medicine Provider Triage Evaluation Note  Cheryl Perez , a 25 y.o. female  was evaluated in triage.  Pt complains of low back pain.  Reports a hx of bulging disc at L4-L5.  Reports tonight at work she took a step forward and felt an intense, sharp pain in her lower back.  Denies falls or other trauma.  Also denies numbness, tingling or weakness in her legs.  Denies saddle anesthesia or bowel or bladder dysfunction..  Review of Systems  Positive: Low back pain Negative: Numbness, tingling, weakness  Physical Exam  BP 127/79 (BP Location: Right Arm)   Pulse 99   Temp 98.2 F (36.8 C)   Resp (!) 22   SpO2 99%  Gen:   Awake, no distress   Resp:  Normal effort  MSK:   Moves extremities without difficulty; midline tenderness to palpation L3-L5 with paraspinal tenderness as well. Other:  Sensation intact to the bilateral lower extremities  Medical Decision Making  Medically screening exam initiated at 1:00 AM.  Appropriate orders placed.  Cheryl Perez was informed that the remainder of the evaluation will be completed by another provider, this initial triage assessment does not replace that evaluation, and the importance of remaining in the ED until their evaluation is complete.  Low back pain.  Imaging pending along with UA.   Sharnell Knight, Boyd Kerbs 01/04/21 0103    Gilda Crease, MD 01/04/21 0630

## 2021-01-04 NOTE — ED Triage Notes (Signed)
Pt c/o lower back pain after stepping into pt's room for vital signs approx 1930-2000. Pt has hx bulging disks L4-5, injury in similar spot. Pt states she is hardly able to sit/stand up straight, 10/10 pain

## 2021-01-04 NOTE — ED Provider Notes (Signed)
[facility] EMERGENCY DEPARTMENT Provider Note   CSN: [id number] Arrival date & time: 01/03/21  2244     History Chief Complaint  Patient presents with   Back Pain    Cheryl Perez is a 25 y.o. female.  HPI Patient works as a Agricultural engineer.  She reports she was at work having a normal day.  She took a step and got a sudden severe pain in her central lower back just above the sacrum.  She reports it was a really sharp pain.  It eased off a little bit so she continued her work.  She reports she did some very light patient lifting assist and the pain suddenly got much worse and started radiating down her buttocks and to about her hips bilaterally.  Did not radiate beyond the hips.  Patient denies she has weakness or numbness in the legs.  She does not have any loss of sensation in the genital area.  She reports she has been able to urinate normally she reports the pain however is so intense that it had her walking slightly bent forward and excruciating pain with twisting and certain movements.  Patient reports she got diagnosed with bulging disks when she was 51 or 25 years old and playing softball.  She reports that it was treated conservatively and she was discharged from the care of neurosurgery without any interventions.  She reports she will get some lower back pain if she works sequential shifts but usually with some rest and local therapy she does fine.  This is the first time she has had a severe exacerbation since her early teenage years.    Past Medical History:  Diagnosis Date   Migraine     There are no problems to display for this patient.   No past surgical history on file.   OB History   No obstetric history on file.     No family history on file.     Home Medications Prior to Admission medications   Medication Sig Start Date End Date Taking? Authorizing Provider  acetaminophen (TYLENOL) 325 MG tablet Take 650 mg by mouth every 6 (six) hours as  needed for mild pain.   Yes [provider]  Acetaminophen-Caff-Pyrilamine (MIDOL COMPLETE) 500-60-15 MG TABS Take 2 tablets by mouth daily as needed (pain).   Yes [provider]  HYDROcodone-acetaminophen (NORCO/VICODIN) 5-325 MG tablet Take 1-2 tablets by mouth every 6 (six) hours as needed for moderate pain or severe pain. 01/04/21  Yes Arby Barrette, MD  ibuprofen (ADVIL) 200 MG tablet Take 200 mg by mouth every 6 (six) hours as needed for fever or headache.   Yes [provider]  methocarbamol (ROBAXIN) 500 MG tablet Take 1 tablet (500 mg total) by mouth every 6 (six) hours as needed for muscle spasms. 01/04/21  Yes Arby Barrette, MD  methylPREDNISolone (MEDROL DOSEPAK) 4 MG TBPK tablet Per pack nstructions 01/04/21  Yes Wynter Isaacs, Lebron Conners, MD  rizatriptan (MAXALT) 5 MG tablet Take 5 mg by mouth as needed for migraine. May repeat in 2 hours if needed   Yes [provider]    Allergies    Amoxicillin, Bactrim [sulfamethoxazole-trimethoprim], Shellfish allergy, and Omnicef [cefdinir]  Review of Systems   Review of Systems 10 systems reviewed and negative except as per HPI Physical Exam Updated Vital Signs BP (!) 113/54 (BP Location: Right Arm)   Pulse 99   Temp 98.2 F (36.8 C)   Resp 20   Ht 5\' 3"  (  1.6 m)   Wt 104.3 kg   SpO2 99%   BMI 40.74 kg/m   Physical Exam Constitutional:      Appearance: Normal appearance.  HENT:     Mouth/Throat:     Pharynx: Oropharynx is clear.  Cardiovascular:     Rate and Rhythm: Normal rate and regular rhythm.  Pulmonary:     Effort: Pulmonary effort is normal.     Breath sounds: Normal breath sounds.  Abdominal:     General: There is no distension.     Palpations: Abdomen is soft.     Tenderness: There is no abdominal tenderness. There is no guarding.  Musculoskeletal:     Comments: Lower extremities are symmetric.  No edema.  Calf soft and nontender.  Pulses 2+ and strong.  Good musculature.  Patient does  have some exacerbation of pain with movement of the legs.  She has no lower extremity weakness.  Focus of pain is at the top of the sacrum at about the L4-5 region.  Skin:    General: Skin is warm and dry.  Neurological:     General: No focal deficit present.     Mental Status: She is alert and oriented to person, place, and time.  Psychiatric:        Mood and Affect: Mood normal.    ED Results / Procedures / Treatments   Labs (all labs ordered are listed, but only abnormal results are displayed) Labs Reviewed  URINALYSIS, ROUTINE W REFLEX MICROSCOPIC - Abnormal; Notable for the following components:      Result Value   Specific Gravity, Urine >1.030 (*)    Hgb urine dipstick MODERATE (*)    All other components within normal limits  URINALYSIS, MICROSCOPIC (REFLEX) - Abnormal; Notable for the following components:   Bacteria, UA RARE (*)    All other components within normal limits  PREGNANCY, URINE    EKG None  Radiology DG Lumbar Spine Complete  Result Date: 01/04/2021 CLINICAL DATA:  Back pain. Pt complains of low back pain. Reports a hx of bulging disc at L4-L5. Reports tonight at work she took a step forward and felt an intense, sharp pain in her lower back EXAM: LUMBAR SPINE - COMPLETE 4+ VIEW COMPARISON:  None. FINDINGS: There is no evidence of lumbar spine fracture. Alignment is normal. Intervertebral disc spaces are maintained. IMPRESSION: Negative. Electronically Signed   By: Helyn Numbers MD   On: 01/04/2021 02:59    Procedures Procedures   Medications Ordered in ED Medications  oxyCODONE-acetaminophen (PERCOCET/ROXICET) 5-325 MG per tablet 2 tablet (2 tablets Oral Given 01/04/21 0107)  methocarbamol (ROBAXIN) tablet 500 mg (500 mg Oral Given 01/04/21 0107)  ketorolac (TORADOL) injection 60 mg (60 mg Intramuscular Given 01/04/21 0848)  HYDROmorphone (DILAUDID) injection 1 mg (1 mg Intramuscular Given 01/04/21 0849)  methocarbamol (ROBAXIN) tablet 750 mg (750 mg Oral  Given 01/04/21 0848)  predniSONE (DELTASONE) tablet 60 mg (60 mg Oral Given 01/04/21 0848)    ED Course  I have reviewed the triage vital signs and the nursing notes.  Pertinent labs & imaging results that were available during my care of the patient were reviewed by me and considered in my medical decision making (see chart for details).    MDM Rules/Calculators/A&P                           Patient has prior history of lumbar disc protrusions at a young age.  She got spontaneous low back pain and then radicular pain that goes to the hips.  No neurologic dysfunction.  Patient was in significant mount of pain.  She is treated with IM Dilaudid and Toradol.  Patient is started with prednisone orally in the emergency department and a Medrol Dosepak to continue on outpatient basis.  Patient is counseled on use of acetaminophen and Vicodin with decreasing Vicodin use as soon as possible.  Return precautions reviewed.  Follow-up plan with recommendations of follow-up with [facility]. Final Clinical Impression(s) / ED Diagnoses Final diagnoses:  Acute bilateral low back pain with bilateral sciatica    Rx / DC Orders ED Discharge Orders          Ordered    methylPREDNISolone (MEDROL DOSEPAK) 4 MG TBPK tablet        01/04/21 0934    HYDROcodone-acetaminophen (NORCO/VICODIN) 5-325 MG tablet  Every 6 hours PRN        01/04/21 0934    methocarbamol (ROBAXIN) 500 MG tablet  Every 6 hours PRN        01/04/21 0934             Arby Barrette, MD 01/04/21 [phone]

## 2021-01-12 DIAGNOSIS — S39012A Strain of muscle, fascia and tendon of lower back, initial encounter: Secondary | ICD-10-CM | POA: Diagnosis not present

## 2021-01-19 ENCOUNTER — Other Ambulatory Visit: Payer: Self-pay

## 2021-01-19 ENCOUNTER — Encounter: Payer: Self-pay | Admitting: Physical Therapy

## 2021-01-19 ENCOUNTER — Ambulatory Visit: Payer: 59 | Attending: Neurosurgery | Admitting: Physical Therapy

## 2021-01-19 DIAGNOSIS — R293 Abnormal posture: Secondary | ICD-10-CM | POA: Diagnosis not present

## 2021-01-19 DIAGNOSIS — R252 Cramp and spasm: Secondary | ICD-10-CM | POA: Insufficient documentation

## 2021-01-19 DIAGNOSIS — M545 Low back pain, unspecified: Secondary | ICD-10-CM | POA: Diagnosis not present

## 2021-01-19 NOTE — Patient Instructions (Addendum)
Trigger Point Dry Needling  What is Trigger Point Dry Needling (DN)? DN is a physical therapy technique used to treat muscle pain and dysfunction. Specifically, DN helps deactivate muscle trigger points (muscle knots).  A thin filiform needle is used to penetrate the skin and stimulate the underlying trigger point. The goal is for a local twitch response (LTR) to occur and for the trigger point to relax. No medication of any kind is injected during the procedure.   What Does Trigger Point Dry Needling Feel Like?  The procedure feels different for each individual patient. Some patients report that they do not actually feel the needle enter the skin and overall the process is not painful. Very mild bleeding may occur. However, many patients feel a deep cramping in the muscle in which the needle was inserted. This is the local twitch response.   How Will I feel after the treatment? Soreness is normal, and the onset of soreness may not occur for a few hours. Typically this soreness does not last longer than two days.  Bruising is uncommon, however; ice can be used to decrease any possible bruising.  In rare cases feeling tired or nauseous after the treatment is normal. In addition, your symptoms may get worse before they get better, this period will typically not last longer than 24 hours.   What Can I do After My Treatment? Increase your hydration by drinking more water for the next 24 hours. You may place ice or heat on the areas treated that have become sore, however, do not use heat on inflamed or bruised areas. Heat often brings more relief post needling. You can continue your regular activities, but vigorous activity is not recommended initially after the treatment for 24 hours. DN is best combined with other physical therapy such as strengthening, stretching, and other therapies.     Access Code: [email or web address] URL: [email or web address] Date: 01/19/2021 Prepared by: Anabel Halon  Exercises Hooklying Single Knee to Chest Stretch - 2 x daily - 7 x weekly - 1 sets - 2 reps - 20s hold Supine Piriformis Stretch with Leg Straight - 2 x daily - 7 x weekly - 1 sets - 2 reps - 20s hold Supine Hip Adduction Isometric with Ball - 2 x daily - 7 x weekly - 1 sets - 15 reps - 5s hold Supine Transversus Abdominis Bracing - Hands on Stomach - 2 x daily - 7 x weekly - 1 sets - 10 reps - 3s hold Seated Hamstring Stretch - 2 x daily - 7 x weekly - 1 sets - 2 reps - 20s hold

## 2021-01-19 NOTE — Therapy (Signed)
[facility] [address] [place], [place], [address] Phone: [phone]   Fax:  [phone]  Physical Therapy Evaluation  Patient Details  Name: Cheryl Perez MRN: [id number] Date of Birth: [birth year] Referring Provider (PT): Julio Sicks, MD   Encounter Date: 01/19/2021   PT End of Session - 01/19/21 1739     Visit Number 1    Date for PT Re-Evaluation 03/16/21    Authorization Type [employer]    PT Start Time [phone]    PT Stop Time 1527    PT Time Calculation (min) 38 min    Activity Tolerance Patient tolerated treatment well    Behavior During Therapy [facility] for tasks assessed/performed             Past Medical History:  Diagnosis Date   Migraine     History reviewed. No pertinent surgical history.  There were no vitals filed for this visit.    Subjective Assessment - 01/19/21 1459     Subjective Patient presenting due to low back pain. States that increased pain ocurred 8/1 but she had been experiencing back soreness approx. 1 week prior to this. However, 8/1 she was walking into patient room and felt sudden sharp pain. Pain did not resolve and patient reported to ER. Has history of low back pain but this pain was new.    How long can you sit comfortably? 5 minutes on busy days; 30-60 minutes on light days    How long can you stand comfortably? unlimited    Currently in Pain? Yes    Pain Score 2     Pain Location Back    Pain Orientation Lower;Mid    Pain Descriptors / Indicators Discomfort                [facility] PT Assessment - 01/19/21 0001       Assessment   Medical Diagnosis S39.012A (ICD-10-CM) - Strain of muscle, fascia and tendon of lower back, initial encounter    Referring Provider (PT) Julio Sicks, MD    Hand Dominance Left    Prior Therapy No      Precautions   Precautions None      Restrictions   Weight Bearing Restrictions No      Balance Screen   Has the patient  fallen in the past 6 months No    Has the patient had a decrease in activity level because of a fear of falling?  No    Is the patient reluctant to leave their home because of a fear of falling?  No      Prior Function   Level of Independence Independent    Vocation Full time employment    Chemical engineer   Overall Cognitive Status Within Functional Limits for tasks assessed      Observation/Other Assessments   Focus on Therapeutic Outcomes (FOTO)  63 (goal 76)      Sensation   Light Touch Appears Intact      Posture/Postural Control   Posture/Postural Control Postural limitations    Postural Limitations Rounded Shoulders;Decreased lumbar lordosis      ROM / Strength   AROM / PROM / Strength AROM;Strength      AROM   Overall AROM  Within functional limits for tasks performed    Overall AROM Comments No lumbar AROM impairments      Strength   Overall Strength Within functional limits for  tasks performed    Overall Strength Comments bil LE strength grossly 5/5; bil glute med 4+/5      Flexibility   Soft Tissue Assessment /Muscle Length yes    Hamstrings impaired Lt/Rt      Palpation   Palpation comment bilateral lumbar paraspinals and erector spinae tender to palpation; pain with PA to L3-4      Special Tests    Special Tests Lumbar;Hip Special Tests;Sacrolliac Tests    Lumbar Tests FABER test;Slump Test;Straight Leg Raise    Sacroiliac Tests  Gaenslen's Test    Hip Special Tests  SI Compression;Other      FABER test   findings Positive    Side LEft      Slump test   Findings Negative      Straight Leg Raise   Findings Negative      Gaenslen's test   Findings Positive    Side  Left      SI Compression   Findings Negative      other   Comments FADIR negative Lt/Rt      Transfers   Transfers Sit to Stand;Stand to Sit;Sit to Supine;Supine to Sit    Sit to Stand 7: Independent    Stand to Sit 7: Independent    Supine to  Sit 7: Independent    Sit to Supine 7: Independent                        Objective measurements completed on examination: See above findings.               PT Education - 01/19/21 1529     Education Details Access Code [email or web address]    Person(s) Educated Patient    Methods Explanation;Demonstration;Tactile cues;Verbal cues;Handout    Comprehension Verbalized understanding;Returned demonstration;Verbal cues required;Tactile cues required              PT Short Term Goals - 01/19/21 1737       PT SHORT TERM GOAL #1   Title Patient will be independent with HEP for continued progression at home.    Time 4    Period Weeks    Status New    Target Date 02/16/21      PT SHORT TERM GOAL #2   Title Patient will report sitting tolerance improved to 45 minutes despite daily activity level to more readily complete work duties.    Time 4    Period Weeks    Status New    Target Date 02/16/21               PT Long Term Goals - 01/19/21 1737       PT LONG TERM GOAL #1   Title Patient will be independent with advanced HEP for long term management of symptoms post D/C.    Time 8    Period Weeks    Status New    Target Date 03/16/21      PT LONG TERM GOAL #2   Title Patient will improved FOTO goal to at least 76 to indicate improved overall function.    Baseline 63    Time 8    Period Weeks    Status New    Target Date 03/16/21                    Plan - 01/19/21 1529     Clinical Impression Statement Patient is a 25 y/o female referred due to low  back pain. PMH includes history of low back pain. Patient reported limitations include prolonged sitting and repetitive bending and lifting. Patient demos no apparent lumbar ROM deficits. Bilateral LE strength 5/5. She reports pain with palpation to bilateral lumbar paraspinals and PAs at L2-4. 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    Personal Factors and Comorbidities Profession    Stability/Clinical Decision Making Stable/Uncomplicated    Clinical Decision Making Low    Rehab Potential Excellent    PT Frequency 1x / week    PT Duration 8 weeks    PT Treatment/Interventions ADLs/Self Care Home Management;Cryotherapy;Electrical Stimulation;Moist Heat;Functional mobility training;Therapeutic activities;Therapeutic exercise;Patient/family education;Manual techniques;Dry needling;Joint Manipulations;Spinal Manipulations    PT Next Visit Plan review HEP; begin core strengthening and gentle functional mobility training to include lifts and carries; introduce DN    PT Home Exercise Plan Access Code: [email or web address]    Consulted and Agree with Plan of Care Patient             Patient will benefit from skilled therapeutic intervention in order to improve the following deficits and impairments:  Decreased activity tolerance, Increased muscle spasms, Increased fascial restricitons, Impaired flexibility, Improper body mechanics, Postural dysfunction, Pain  Visit Diagnosis: Acute midline low back pain without sciatica - Plan: PT plan of care cert/re-cert  Cramp and spasm - Plan: PT plan of care cert/re-cert  Abnormal posture - Plan: PT plan of care cert/re-cert     Problem List There are no problems to display for this patient.  Anabel Halon PT, DPT 01/19/21 5:45 PM   [facility] [address], [address] [place], [place], [address] Phone: [phone]   Fax:  [phone]  Name: Cheryl Perez MRN: [id number] Date of Birth: 07/18/1995

## 2021-01-28 ENCOUNTER — Telehealth: Payer: Self-pay | Admitting: Physical Therapy

## 2021-01-28 ENCOUNTER — Ambulatory Visit: Payer: 59 | Admitting: Physical Therapy

## 2021-01-28 NOTE — Telephone Encounter (Signed)
Left voicemail regarding missed appt today at 9:30.  Reminded of next appt scheduled for 9/2.

## 2021-02-04 ENCOUNTER — Encounter: Payer: 59 | Admitting: Physical Therapy

## 2021-02-09 ENCOUNTER — Ambulatory Visit: Payer: 59 | Attending: Neurosurgery

## 2021-02-09 ENCOUNTER — Other Ambulatory Visit: Payer: Self-pay

## 2021-02-09 DIAGNOSIS — R252 Cramp and spasm: Secondary | ICD-10-CM | POA: Diagnosis not present

## 2021-02-09 DIAGNOSIS — R293 Abnormal posture: Secondary | ICD-10-CM | POA: Insufficient documentation

## 2021-02-09 DIAGNOSIS — M545 Low back pain, unspecified: Secondary | ICD-10-CM | POA: Insufficient documentation

## 2021-02-09 NOTE — Therapy (Addendum)
[facility] [address], [place], [place], [address] Phone: [phone]   Fax:  [phone]  Physical Therapy Treatment  Patient Details  Name: Cheryl Perez MRN: [id number] Date of Birth: 08-21-1995 Referring Provider (PT): Earnie Larsson, MD   Encounter Date: 02/09/2021   PT End of Session - 02/09/21 1438     Visit Number 2    Date for PT Re-Evaluation 03/16/21    Authorization Type [employer]    PT Start Time 1401    PT Stop Time 1435    PT Time Calculation (min) 34 min    Activity Tolerance Patient tolerated treatment well    Behavior During Therapy [facility] for tasks assessed/performed             Past Medical History:  Diagnosis Date   Migraine     History reviewed. No pertinent surgical history.  There were no vitals filed for this visit.   Subjective Assessment - 02/09/21 1358     Subjective I feel OK today.  I had back pain on Monday because I worked all weekend.  My pain was 6/10 then.    Currently in Pain? 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                              [facility] Adult PT Treatment/Exercise - 02/09/21 0001       Exercises   Exercises Knee/Hip;Lumbar      Lumbar Exercises: Stretches   Active Hamstring Stretch Left;Right;20 seconds    Single Knee to Chest Stretch 2 reps;20 seconds    Lower Trunk Rotation 2 reps;20 seconds    Piriformis Stretch 2 reps;20 seconds    Piriformis Stretch Limitations seated and supine      Lumbar Exercises: Aerobic   Nustep Level 2x 6 minutes- PT present to discuss progress      Lumbar Exercises: Supine   Ab Set 10 reps    AB Set Limitations ball squeeze 5" x10      Manual Therapy   Manual Therapy Soft tissue mobilization;Myofascial release    Manual therapy comments skilled palpation and assessment during DN today    Soft tissue mobilization elongation and release to  bil lumbar paraspinals              Trigger Point Dry Needling - 02/09/21 0001     Consent Given? Yes    Education Handout Provided Previously provided    Muscles Treated Back/Hip Lumbar multifidi    Lumbar multifidi Response Twitch response elicited;Palpable increased muscle length                Upper Extremity Functional Index Score :   /80     PT Short Term Goals - 01/19/21 1737       PT SHORT TERM GOAL #1   Title Patient will be independent with HEP for continued progression at home.    Time 4    Period Weeks    Status New    Target Date 02/16/21      PT SHORT TERM GOAL #2   Title Patient will report sitting tolerance improved to 45 minutes despite daily activity level to more readily complete work duties.    Time 4    Period Weeks    Status New    Target Date 02/16/21  PT Long Term Goals - 01/19/21 1737       PT LONG TERM GOAL #1   Title Patient will be independent with advanced HEP for long term management of symptoms post D/C.    Time 8    Period Weeks    Status New    Target Date 03/16/21      PT LONG TERM GOAL #2   Title Patient will improved FOTO goal to at least 76 to indicate improved overall function.    Baseline 63    Time 8    Period Weeks    Status New    Target Date 03/16/21                   Plan - 02/09/21 1412     Clinical Impression Statement First time follow-up after evaluation.  Pt arrived without pain today but had 6/10 LBP with work over the weekend.  Pt has been exercising as able. Session focused on review of HEP and pt was able to demonstrate all aspects correctly.  Pt with tension in bil lumbar spine and had good twitch response and had improved tissue mobility after needling and manual therapy.  Pt will continue with current HEP until next session and further advancements will be made next session as needed.  Pt will continue to benefit from skilled PT to address LBP, tissue mobility,  flexibility and lumbopelvic strength.    PT Frequency 1x / week    PT Duration 8 weeks    PT Treatment/Interventions ADLs/Self Care Home Management;Cryotherapy;Electrical Stimulation;Moist Heat;Functional mobility training;Therapeutic activities;Therapeutic exercise;Patient/family education;Manual techniques;Dry needling;Joint Manipulations;Spinal Manipulations    PT Next Visit Plan assess response to DN and do again if helpful, work on lifting, add lumbopelvic strength to HEP.    PT Home Exercise Plan Access Code: [email or web address]    Recommended Other Services initial cert is signed    Consulted and Agree with Plan of Care Patient             Patient will benefit from skilled therapeutic intervention in order to improve the following deficits and impairments:  Decreased activity tolerance, Increased muscle spasms, Increased fascial restricitons, Impaired flexibility, Improper body mechanics, Postural dysfunction, Pain  Visit Diagnosis: Acute midline low back pain without sciatica  Cramp and spasm  Abnormal posture     Problem List There are no problems to display for this patient.   Sigurd Sos, PT 02/09/21 2:40 PM  PHYSICAL THERAPY DISCHARGE SUMMARY  Visits from Start of Care: 2  Current functional level related to goals / functional outcomes: See above for current status.  Pt didn't return to PT.     Remaining deficits: See above.     Education / Equipment: HEP   Patient agrees to discharge. Patient goals were not met. Patient is being discharged due to not returning since the last visit.  Sigurd Sos, PT 03/16/21 2:41 PM   [facility] [address] [place], [place], [address] Phone: [phone]   Fax:  [phone]  Name: Daniyah Fohl MRN: [id number] Date of Birth: 11/12/1995

## 2021-02-16 ENCOUNTER — Ambulatory Visit: Payer: 59

## 2021-02-23 ENCOUNTER — Ambulatory Visit: Payer: 59

## 2021-03-02 ENCOUNTER — Ambulatory Visit: Payer: 59

## 2021-03-16 ENCOUNTER — Ambulatory Visit: Payer: 59 | Attending: Neurosurgery

## 2021-03-16 DIAGNOSIS — M545 Low back pain, unspecified: Secondary | ICD-10-CM | POA: Insufficient documentation

## 2021-03-16 DIAGNOSIS — R293 Abnormal posture: Secondary | ICD-10-CM | POA: Insufficient documentation

## 2021-03-16 DIAGNOSIS — R252 Cramp and spasm: Secondary | ICD-10-CM | POA: Insufficient documentation

## 2021-03-18 DIAGNOSIS — Z20822 Contact with and (suspected) exposure to covid-19: Secondary | ICD-10-CM | POA: Diagnosis not present

## 2021-03-18 DIAGNOSIS — Z03818 Encounter for observation for suspected exposure to other biological agents ruled out: Secondary | ICD-10-CM | POA: Diagnosis not present

## 2021-03-18 DIAGNOSIS — J069 Acute upper respiratory infection, unspecified: Secondary | ICD-10-CM | POA: Diagnosis not present

## 2021-06-24 ENCOUNTER — Emergency Department: Admission: EM | Admit: 2021-06-24 | Discharge: 2021-06-24 | Discharge status: Absent without leave | Payer: Self-pay

## 2021-06-24 DIAGNOSIS — R002 Palpitations: Secondary | ICD-10-CM | POA: Diagnosis not present

## 2021-06-24 DIAGNOSIS — R079 Chest pain, unspecified: Secondary | ICD-10-CM | POA: Diagnosis not present

## 2021-06-24 DIAGNOSIS — Z881 Allergy status to other antibiotic agents status: Secondary | ICD-10-CM | POA: Diagnosis not present

## 2021-06-24 DIAGNOSIS — Z886 Allergy status to analgesic agent status: Secondary | ICD-10-CM | POA: Diagnosis not present

## 2021-06-24 DIAGNOSIS — Z888 Allergy status to other drugs, medicaments and biological substances status: Secondary | ICD-10-CM | POA: Diagnosis not present

## 2021-06-24 DIAGNOSIS — Z882 Allergy status to sulfonamides status: Secondary | ICD-10-CM | POA: Diagnosis not present

## 2021-06-24 DIAGNOSIS — I498 Other specified cardiac arrhythmias: Secondary | ICD-10-CM | POA: Diagnosis not present

## 2021-06-24 DIAGNOSIS — Z79899 Other long term (current) drug therapy: Secondary | ICD-10-CM | POA: Diagnosis not present

## 2021-08-06 DIAGNOSIS — M25571 Pain in right ankle and joints of right foot: Secondary | ICD-10-CM | POA: Diagnosis not present

## 2021-08-06 DIAGNOSIS — S93401A Sprain of unspecified ligament of right ankle, initial encounter: Secondary | ICD-10-CM | POA: Diagnosis not present

## 2021-08-06 DIAGNOSIS — X58XXXA Exposure to other specified factors, initial encounter: Secondary | ICD-10-CM | POA: Diagnosis not present

## 2021-08-06 DIAGNOSIS — G8911 Acute pain due to trauma: Secondary | ICD-10-CM | POA: Diagnosis not present

## 2021-08-21 ENCOUNTER — Other Ambulatory Visit: Payer: Self-pay

## 2021-08-21 ENCOUNTER — Emergency Department (INDEPENDENT_AMBULATORY_CARE_PROVIDER_SITE_OTHER)
Admission: EM | Admit: 2021-08-21 | Discharge: 2021-08-21 | Disposition: A | Discharge status: Home / Self Care | Payer: 59 | Source: Home / Self Care | Attending: Family Medicine | Admitting: Family Medicine

## 2021-08-21 ENCOUNTER — Encounter: Payer: Self-pay | Admitting: Emergency Medicine

## 2021-08-21 DIAGNOSIS — R319 Hematuria, unspecified: Secondary | ICD-10-CM

## 2021-08-21 DIAGNOSIS — Z87442 Personal history of urinary calculi: Secondary | ICD-10-CM | POA: Diagnosis not present

## 2021-08-21 DIAGNOSIS — R109 Unspecified abdominal pain: Secondary | ICD-10-CM | POA: Diagnosis not present

## 2021-08-21 LAB — POCT URINALYSIS DIP (MANUAL ENTRY)
Bilirubin, UA: NEGATIVE
Glucose, UA: NEGATIVE mg/dL
Ketones, POC UA: NEGATIVE mg/dL
Leukocytes, UA: NEGATIVE
Nitrite, UA: NEGATIVE
Protein Ur, POC: NEGATIVE mg/dL
Spec Grav, UA: >=1.03 — AB (ref 1.010–1.025)
Urobilinogen, UA: 0.2 E.U./dL
pH, UA: 5 (ref 5.0–8.0)

## 2021-08-21 MED ORDER — ACETAMINOPHEN 325 MG PO TABS
650.0000 mg | ORAL_TABLET | Freq: Once | ORAL | Status: AC
  Administered 2021-08-21: 650 mg via ORAL

## 2021-08-21 MED ORDER — HYDROCODONE-ACETAMINOPHEN 7.5-325 MG PO TABS: 1.0000 | ORAL_TABLET | Freq: Four times a day (QID) | ORAL | 0 refills | Status: DC | PRN

## 2021-08-21 MED ORDER — TAMSULOSIN HCL 0.4 MG PO CAPS: 0.4000 mg | ORAL_CAPSULE | Freq: Every day | ORAL | 1 refills | Status: DC

## 2021-08-21 MED ORDER — KETOROLAC TROMETHAMINE 60 MG/2ML IM SOLN
60.0000 mg | Freq: Once | INTRAMUSCULAR | Status: AC
  Administered 2021-08-21: 60 mg via INTRAMUSCULAR

## 2021-08-21 NOTE — Discharge Instructions (Signed)
Home to rest.  You must push fluids and drink more than you currently are ?Take over-the-counter medicines for moderate pain ?Take hydrocodone for severe pain.  Do not drive on hydrocodone ?Take tamsulosin once a day.  This help relax urinary tract and passed stone ?Call your doctor tomorrow if you are not improving.  You may need additional x-rays or imaging ?Go to the emergency room if you get worse instead of better at any time ?

## 2021-08-21 NOTE — ED Provider Notes (Signed)
?[facility] ? ? ? ?CSN: [id number] ?Arrival date & time: 08/21/21  1407 ? ? ?  ? ?History   ?Chief Complaint ?Chief Complaint  ?Patient presents with  ? Flank Pain  ?  right  ? ? ?HPI ?Cheryl Perez is a 26 y.o. female.  ? ?HPI ? ?Patient states she has a history of kidney stones.  Has had right flank pain for 3 days.  She states this morning was severe.  She states this morning it was bad enough that it felt like one of her kidney stones.  She has not had any sweats chills or fever.  She has had nausea but no vomiting.  She has not noted any urinary symptoms or hematuria.  She does not complain of abdominal pain.  She states initially heating pad helped but that does not relieve the pain at this point. ? ?Past Medical History:  ?Diagnosis Date  ? Migraine   ? ? ?There are no problems to display for this patient. ? ? ?History reviewed. No pertinent surgical history. ? ?OB History   ?No obstetric history on file. ?  ? ? ? ?Home Medications   ? ?Prior to Admission medications   ?Medication Sig Start Date End Date Taking? Authorizing Provider  ?HYDROcodone-acetaminophen (NORCO) 7.5-325 MG tablet Take 1 tablet by mouth every 6 (six) hours as needed for moderate pain. 08/21/21  Yes Eustace Moore, MD  ?tamsulosin (FLOMAX) 0.4 MG CAPS capsule Take 1 capsule (0.4 mg total) by mouth daily after supper. 08/21/21  Yes Eustace Moore, MD  ?acetaminophen (TYLENOL) 325 MG tablet Take 650 mg by mouth every 6 (six) hours as needed for mild pain.    [provider]  ?ibuprofen (ADVIL) 200 MG tablet Take 200 mg by mouth every 6 (six) hours as needed for fever or headache.    [provider]  ?rizatriptan (MAXALT) 5 MG tablet Take 5 mg by mouth as needed for migraine. May repeat in 2 hours if needed    [provider]  ? ? ?Family History ?Family History  ?Problem Relation Age of Onset  ? Diabetes Mother   ? Heart failure Father   ? Diabetes Father   ? ? ?Social History ?Social History   ? ?Tobacco Use  ? Smoking status: Never  ?  Passive exposure: Never  ? Smokeless tobacco: Never  ?Vaping Use  ? Vaping Use: Never used  ?Substance Use Topics  ? Alcohol use: Not Currently  ? Drug use: Never  ? ? ? ?Allergies   ?Amoxicillin, Bactrim [sulfamethoxazole-trimethoprim], Shellfish allergy, and Omnicef [cefdinir] ? ? ?Review of Systems ?Review of Systems ? ?See HPI ?Physical Exam ?Triage Vital Signs ?ED Triage Vitals  ?Enc Vitals Group  ?   BP 08/21/21 1440 122/70  ?   Pulse Rate 08/21/21 1440 (!) 102  ?   Resp 08/21/21 1440 18  ?   Temp 08/21/21 1440 99.4 ?F (37.4 ?C)  ?   Temp Source 08/21/21 1440 Oral  ?   SpO2 08/21/21 1440 98 %  ?   Weight 08/21/21 1444 230 lb (104.3 kg)  ?   Height 08/21/21 1444 5\' 3"  (1.6 m)  ?   Head Circumference --   ?   Peak Flow --   ?   Pain Score 08/21/21 1443 6  ?   Pain Loc --   ?   Pain Edu? --   ?   Excl. in GC? --   ? ?No data found. ? ?  Updated Vital Signs ?BP 122/70 (BP Location: Right Arm)   Pulse (!) 102   Temp 99.4 ?F (37.4 ?C) (Oral)   Resp 18   Ht 5\' 3"  (1.6 m)   Wt 104.3 kg   LMP 08/13/2021 (Exact Date)   SpO2 98%   BMI 40.74 kg/m?  ?   ? ?Physical Exam ?Constitutional:   ?   General: She is not in acute distress. ?   Appearance: She is well-developed. She is obese.  ?HENT:  ?   Head: Normocephalic and atraumatic.  ?   Right Ear: Tympanic membrane and ear canal normal.  ?   Left Ear: Tympanic membrane and ear canal normal.  ?   Nose: Nose normal. No congestion.  ?   Mouth/Throat:  ?   Mouth: Mucous membranes are dry.  ?   Pharynx: No posterior oropharyngeal erythema.  ?Eyes:  ?   Conjunctiva/sclera: Conjunctivae normal.  ?   Pupils: Pupils are equal, round, and reactive to light.  ?Cardiovascular:  ?   Rate and Rhythm: Normal rate.  ?   Heart sounds: Normal heart sounds.  ?Pulmonary:  ?   Effort: Pulmonary effort is normal. No respiratory distress.  ?   Breath sounds: Normal breath sounds.  ?Abdominal:  ?   General: There is no distension.  ?   Palpations:  Abdomen is soft.  ?   Tenderness: There is abdominal tenderness. There is right CVA tenderness.  ?Musculoskeletal:     ?   General: Normal range of motion.  ?   Cervical back: Normal range of motion.  ?Skin: ?   General: Skin is warm and dry.  ?Neurological:  ?   Mental Status: She is alert.  ?Psychiatric:     ?   Mood and Affect: Mood normal.     ?   Behavior: Behavior normal.  ? ? ? ?UC Treatments / Results  ?Labs ?(all labs ordered are listed, but only abnormal results are displayed) ?Labs Reviewed  ?POCT URINALYSIS DIP (MANUAL ENTRY) - Abnormal; Notable for the following components:  ?    Result Value  ? Clarity, UA cloudy (*)   ? Spec Grav, UA >=1.030 (*)   ? Blood, UA moderate (*)   ? All other components within normal limits  ?URINE CULTURE  ? ? ?EKG ? ? ?Radiology ?No results found. ? ?Procedures ?Procedures (including critical care time) ? ?Medications Ordered in UC ?Medications  ?acetaminophen (TYLENOL) tablet 650 mg (650 mg Oral Given 08/21/21 1455)  ?ketorolac (TORADOL) injection 60 mg (60 mg Intramuscular Given 08/21/21 1530)  ? ? ?Initial Impression / Assessment and Plan / UC Course  ?I have reviewed the triage vital signs and the nursing notes. ? ?Pertinent labs & imaging results that were available during my care of the patient were reviewed by me and considered in my medical decision making (see chart for details). ? ?  ? ?Patient has hematuria.  No physical exam findings, history, to suggest pyelonephritis.  I am going to culture her urine to be sure.  I think she does have a kidney stone based on physical exam.  My biggest concern is her reported decreased urine output.  She does have a very concentrated urine so I suspect she is not drinking enough.  I did emphasize to her that if her kidney is indeed blocked, she risks kidney damage.  If she does not respond overnight to additional conservative treatment and pain management, she needs to contact her doctor to get  additional imaging (ultrasound or  CT) to check her kidney. ?Final Clinical Impressions(s) / UC Diagnoses  ? ?Final diagnoses:  ?Right flank pain  ?Hematuria, unspecified type  ?History of kidney stones  ? ? ? ?Discharge Instructions   ? ?  ?Home to rest.  You must push fluids and drink more than you currently are ?Take over-the-counter medicines for moderate pain ?Take hydrocodone for severe pain.  Do not drive on hydrocodone ?Take tamsulosin once a day.  This help relax urinary tract and passed stone ?Call your doctor tomorrow if you are not improving.  You may need additional x-rays or imaging ?Go to the emergency room if you get worse instead of better at any time ? ? ?ED Prescriptions   ? ? Medication Sig Dispense Auth. Provider  ? tamsulosin (FLOMAX) 0.4 MG CAPS capsule Take 1 capsule (0.4 mg total) by mouth daily after supper. 10 capsule Eustace MooreNelson, Evangeline Utley Sue, MD  ? HYDROcodone-acetaminophen [facility](NORCO) 7.5-325 MG tablet Take 1 tablet by mouth every 6 (six) hours as needed for moderate pain. 10 tablet Eustace MooreNelson, Brionne Mertz Sue, MD  ? ?  ? ?I have reviewed the PDMP during this encounter. ?  ?Eustace MooreNelson, Royale Lennartz Sue, MD ?08/21/21 1612 ? ?

## 2021-08-21 NOTE — ED Triage Notes (Signed)
Hx of kidney stones  ?R flank pain  x 3 days  ?Heating pad was helping  ?Took Pamprin yesterday  ?Decreased urinary output ?

## 2021-08-22 LAB — URINE CULTURE
MICRO NUMBER:: 13151276
Result:: NO GROWTH
SPECIMEN QUALITY:: ADEQUATE

## 2022-01-10 DIAGNOSIS — R059 Cough, unspecified: Secondary | ICD-10-CM | POA: Diagnosis not present

## 2022-01-10 DIAGNOSIS — Z20822 Contact with and (suspected) exposure to covid-19: Secondary | ICD-10-CM | POA: Diagnosis not present

## 2022-01-10 DIAGNOSIS — U071 COVID-19: Secondary | ICD-10-CM | POA: Diagnosis not present

## 2022-01-10 DIAGNOSIS — Z6839 Body mass index (BMI) 39.0-39.9, adult: Secondary | ICD-10-CM | POA: Diagnosis not present

## 2022-04-18 DIAGNOSIS — Z6841 Body Mass Index (BMI) 40.0 and over, adult: Secondary | ICD-10-CM | POA: Diagnosis not present

## 2022-04-18 DIAGNOSIS — R112 Nausea with vomiting, unspecified: Secondary | ICD-10-CM | POA: Diagnosis not present

## 2022-06-01 ENCOUNTER — Encounter (HOSPITAL_COMMUNITY): Payer: Self-pay | Admitting: Physician Assistant

## 2022-06-01 ENCOUNTER — Ambulatory Visit (HOSPITAL_COMMUNITY)
Admission: EM | Admit: 2022-06-01 | Discharge: 2022-06-01 | Disposition: A | Discharge status: Home / Self Care | Payer: 59 | Attending: Internal Medicine | Admitting: Internal Medicine

## 2022-06-01 DIAGNOSIS — R42 Dizziness and giddiness: Secondary | ICD-10-CM | POA: Insufficient documentation

## 2022-06-01 DIAGNOSIS — R5383 Other fatigue: Secondary | ICD-10-CM | POA: Insufficient documentation

## 2022-06-01 DIAGNOSIS — R5381 Other malaise: Secondary | ICD-10-CM | POA: Diagnosis not present

## 2022-06-01 LAB — CBC WITH DIFFERENTIAL/PLATELET
Abs Immature Granulocytes: 0.02 10*3/uL (ref 0.00–0.07)
Basophils Absolute: 0.1 10*3/uL (ref 0.0–0.1)
Basophils Relative: 1 %
Eosinophils Absolute: 0.3 10*3/uL (ref 0.0–0.5)
Eosinophils Relative: 5 %
HCT: 42.6 % (ref 36.0–46.0)
Hemoglobin: 14.5 g/dL (ref 12.0–15.0)
Immature Granulocytes: 0 %
Lymphocytes Relative: 39 %
Lymphs Abs: 2.7 10*3/uL (ref 0.7–4.0)
MCH: 28.8 pg (ref 26.0–34.0)
MCHC: 34 g/dL (ref 30.0–36.0)
MCV: 84.5 fL (ref 80.0–100.0)
Monocytes Absolute: 0.5 10*3/uL (ref 0.1–1.0)
Monocytes Relative: 7 %
Neutro Abs: 3.4 10*3/uL (ref 1.7–7.7)
Neutrophils Relative %: 48 %
Platelets: 226 10*3/uL (ref 150–400)
RBC: 5.04 MIL/uL (ref 3.87–5.11)
RDW: 12.5 % (ref 11.5–15.5)
WBC: 7 10*3/uL (ref 4.0–10.5)
nRBC: 0 % (ref 0.0–0.2)

## 2022-06-01 LAB — COMPREHENSIVE METABOLIC PANEL
ALT: 45 U/L — ABNORMAL HIGH (ref 0–44)
AST: 27 U/L (ref 15–41)
Albumin: 3.9 g/dL (ref 3.5–5.0)
Alkaline Phosphatase: 47 U/L (ref 38–126)
Anion gap: 7 (ref 5–15)
BUN: 11 mg/dL (ref 6–20)
CO2: 21 mmol/L — ABNORMAL LOW (ref 22–32)
Calcium: 9.1 mg/dL (ref 8.9–10.3)
Chloride: 110 mmol/L (ref 98–111)
Creatinine, Ser: 0.82 mg/dL (ref 0.44–1.00)
GFR, Estimated: >60 mL/min (ref >60)
Glucose, Bld: 94 mg/dL (ref 70–99)
Potassium: 3.6 mmol/L (ref 3.5–5.1)
Sodium: 138 mmol/L (ref 135–145)
Total Bilirubin: 0.5 mg/dL (ref 0.3–1.2)
Total Protein: 6.6 g/dL (ref 6.5–8.1)

## 2022-06-01 LAB — POCT URINALYSIS DIPSTICK, ED / UC
Bilirubin Urine: NEGATIVE
Glucose, UA: NEGATIVE mg/dL
Ketones, ur: NEGATIVE mg/dL
Leukocytes,Ua: NEGATIVE
Nitrite: NEGATIVE
Protein, ur: NEGATIVE mg/dL
Specific Gravity, Urine: >=1.03 (ref 1.005–1.030)
Urobilinogen, UA: 0.2 mg/dL (ref 0.0–1.0)
pH: 5.5 (ref 5.0–8.0)

## 2022-06-01 LAB — POC URINE PREG, ED: Preg Test, Ur: NEGATIVE

## 2022-06-01 LAB — CBG MONITORING, ED: Glucose-Capillary: 97 mg/dL (ref 70–99)

## 2022-06-01 LAB — TSH: TSH: 1.956 u[IU]/mL (ref 0.350–4.500)

## 2022-06-01 NOTE — ED Provider Notes (Signed)
[facility]    CSN: [id number] Arrival date & time: 06/01/22  7014      History   Chief Complaint Chief Complaint  Patient presents with   Hypertension   Chills   Irregular Heart Beat   Dizziness    HPI Cheryl Perez is a 26 y.o. female.   Patient presents today with a 24-hour history of fatigue with associated lightheadedness/dizziness.  She describes this as a combination room spinning sensation and lightheadedness.  Denies any associated syncope, headache, dysarthria, focal weakness, visual disturbance.  She did have an episode of nausea and vomiting but this has since resolved.  She does have a history of migraines but states current symptoms are similar to previous episodes of this condition.  Denies any recent illness or additional symptoms including cough or congestion.  She denies any recent head injury.  Denies any medication changes.  Reports that she is feeling very poorly and had to leave work as a result of symptoms.  She did take an at-home COVID test when symptoms began that was negative.  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    Past Medical History:  Diagnosis Date   Migraine     There are no problems to display for this patient.   History reviewed. No pertinent surgical history.  OB History   No obstetric history on file.      Home Medications    Prior to Admission medications   Medication Sig Start Date End Date Taking? Authorizing Provider  acetaminophen (TYLENOL) 325 MG tablet Take 650 mg by mouth every 6 (six) hours as needed for mild pain.    [provider]  HYDROcodone-acetaminophen (NORCO) 7.5-325 MG tablet Take 1 tablet by mouth every 6 (six) hours as needed for moderate pain. 08/21/21   Eustace Moore, MD  ibuprofen (ADVIL) 200 MG tablet Take 200 mg by mouth every 6 (six) hours as needed for fever or headache.    [provider]  rizatriptan (MAXALT) 5 MG tablet Take 5 mg by mouth as  needed for migraine. May repeat in 2 hours if needed    [provider]  tamsulosin (FLOMAX) 0.4 MG CAPS capsule Take 1 capsule (0.4 mg total) by mouth daily after supper. 08/21/21   Eustace Moore, MD    Family History Family History  Problem Relation Age of Onset   Diabetes Mother    Heart failure Father    Diabetes Father     Social History Social History   Tobacco Use   Smoking status: Never    Passive exposure: Never   Smokeless tobacco: Never  Vaping Use   Vaping Use: Never used  Substance Use Topics   Alcohol use: Not Currently   Drug use: Never     Allergies   Amoxicillin, Bactrim [sulfamethoxazole-trimethoprim], Shellfish allergy, and Omnicef [cefdinir]   Review of Systems Review of Systems  Constitutional:  Positive for activity change and fatigue. Negative for appetite change and fever.  HENT:  Negative for congestion, sinus pressure, sneezing and sore throat.   Eyes:  Negative for photophobia and visual disturbance.  Respiratory:  Negative for cough and shortness of breath.   Cardiovascular:  Positive for palpitations. Negative for chest pain and leg swelling.  Gastrointestinal:  Positive for nausea and vomiting. Negative for abdominal pain and diarrhea.  Musculoskeletal:  Negative for arthralgias and myalgias.  Neurological:  Positive for dizziness and light-headedness. Negative for syncope, facial asymmetry, speech difficulty, weakness, numbness  and headaches.     Physical Exam Triage Vital Signs ED Triage Vitals  Enc Vitals Group     BP 06/01/22 1932 136/89     Pulse Rate 06/01/22 1932 81     Resp 06/01/22 1932 16     Temp 06/01/22 1932 98.8 F (37.1 C)     Temp Source 06/01/22 1932 Oral     SpO2 06/01/22 1932 100 %     Weight --      Height --      Head Circumference --      Peak Flow --      Pain Score 06/01/22 1933 6     Pain Loc --      Pain Edu? --      Excl. in GC? --    Orthostatic VS for the past 24 hrs:  BP- Lying  Pulse- Lying BP- Sitting Pulse- Sitting BP- Standing at 0 minutes Pulse- Standing at 0 minutes  06/01/22 2114 125/83 99 124/70 102 130/82 102    Updated Vital Signs BP 136/89 (BP Location: Left Arm)   Pulse 81   Temp 98.8 F (37.1 C) (Oral)   Resp 16   LMP 05/19/2022   SpO2 100%   Visual Acuity Right Eye Distance:   Left Eye Distance:   Bilateral Distance:    Right Eye Near:   Left Eye Near:    Bilateral Near:     Physical Exam Vitals reviewed.  Constitutional:      General: She is awake. She is not in acute distress.    Appearance: Normal appearance. She is well-developed. She is not ill-appearing.     Comments: Very pleasant female presented age in no acute distress sitting comfortably in exam room  HENT:     Head: Normocephalic and atraumatic. No raccoon eyes, Battle's sign or contusion.     Right Ear: Tympanic membrane, ear canal and external ear normal. No hemotympanum.     Left Ear: Tympanic membrane, ear canal and external ear normal. No hemotympanum.     Nose: Nose normal.     Mouth/Throat:     Tongue: Tongue does not deviate from midline.     Pharynx: Uvula midline. No oropharyngeal exudate or posterior oropharyngeal erythema.  Eyes:     Extraocular Movements: Extraocular movements intact.     Conjunctiva/sclera: Conjunctivae normal.     Pupils: Pupils are equal, round, and reactive to light.  Cardiovascular:     Rate and Rhythm: Normal rate and regular rhythm.     Heart sounds: Normal heart sounds, S1 normal and S2 normal. No murmur heard. Pulmonary:     Effort: Pulmonary effort is normal.     Breath sounds: Normal breath sounds. No wheezing, rhonchi or rales.     Comments: Clear to auscultation bilaterally Musculoskeletal:     Cervical back: Normal range of motion and neck supple. No spinous process tenderness or muscular tenderness.     Comments: Strength 5/5 bilateral upper and lower extremities  Lymphadenopathy:     Head:     Right side of head: No  submental, submandibular or tonsillar adenopathy.     Left side of head: No submental, submandibular or tonsillar adenopathy.  Neurological:     General: No focal deficit present.     Mental Status: She is alert and oriented to person, place, and time.     Cranial Nerves: Cranial nerves 2-12 are intact.     Motor: Motor function is intact.  Coordination: Coordination is intact.     Gait: Gait is intact.     Comments: No focal neurological defect on exam.  Psychiatric:        Behavior: Behavior is cooperative.      UC Treatments / Results  Labs (all labs ordered are listed, but only abnormal results are displayed) Labs Reviewed  POCT URINALYSIS DIPSTICK, ED / UC - Abnormal; Notable for the following components:      Result Value   Hgb urine dipstick SMALL (*)    All other components within normal limits  CBC WITH DIFFERENTIAL/PLATELET  COMPREHENSIVE METABOLIC PANEL  TSH  POC URINE PREG, ED  CBG MONITORING, ED    EKG   Radiology No results found.  Procedures Procedures (including critical care time)  Medications Ordered in UC Medications - No data to display  Initial Impression / Assessment and Plan / UC Course  I have reviewed the triage vital signs and the nursing notes.  Pertinent labs & imaging results that were available during my care of the patient were reviewed by me and considered in my medical decision making (see chart for details).     Patient is well-appearing, afebrile, nontoxic, nontachycardic.  Vital signs and physical exam are reassuring today; no indication for emergent evaluation or imaging.  EKG was obtained that showed normal sinus rhythm with ventricular rate of 99 bpm without ischemic changes; no previous to compare.  UA showed increased specific gravity but otherwise was normal with no evidence of infection.  Urine pregnancy was negative.  Glucose was appropriate.  Orthostatic vital signs were normal.  CBC, CMP, TSH obtained today-results  pending.  Will make additional recommendations based on laboratory findings.  She was encouraged to rest and drink plenty of fluid.  Discussed that ultimately she will likely need to follow-up with a primary care provider; she is not currently established with anyone.  Will try to help her find someone via PCP assistance.  Given her intermittent lightheadedness we also recommended that she follow-up with cardiology as she may benefit from Holter monitoring.  She was given contact information for a local provider with instruction to call to schedule an appointment.  If she has any worsening symptoms including syncopal episodes, more frequent/worsening symptoms, chest pain, shortness of breath, heart racing, weakness she needs to be seen immediately.  Strict return precautions given.  Work excuse note provided.  Final Clinical Impressions(s) / UC Diagnoses   Final diagnoses:  Intermittent lightheadedness  Malaise and fatigue     Discharge Instructions      Your workup in clinic was normal.  Will contact you if your lab work is abnormal.  Please make sure that you are drinking plenty of fluid and eat small frequent meals.  If you have any worsening symptoms including losing consciousness, weakness, vision change, nausea/vomiting interpersonal intake you should be seen immediately.  Someone should contact you to schedule an appoint with primary care.  Please call and schedule an appointment with cardiology.     ED Prescriptions   None    PDMP not reviewed this encounter.   Jeani Hawking, PA-C 06/01/22 2154

## 2022-06-01 NOTE — ED Triage Notes (Signed)
Here for dizziness,fatigue and body aches. Pt reports feeling dizzy last night at work.  Pt reports today she feel the same. Took home covid test and it was negative.

## 2022-06-01 NOTE — Discharge Instructions (Signed)
Your workup in clinic was normal.  Will contact you if your lab work is abnormal.  Please make sure that you are drinking plenty of fluid and eat small frequent meals.  If you have any worsening symptoms including losing consciousness, weakness, vision change, nausea/vomiting interpersonal intake you should be seen immediately.  Someone should contact you to schedule an appoint with primary care.  Please call and schedule an appointment with cardiology.

## 2022-06-13 ENCOUNTER — Ambulatory Visit (HOSPITAL_BASED_OUTPATIENT_CLINIC_OR_DEPARTMENT_OTHER): Payer: 59 | Admitting: Cardiology

## 2022-06-13 NOTE — Progress Notes (Incomplete)
Cardiology Office Note:    Date:  06/13/2022   ID:  Cheryl Perez, DOB [birth year], MRN [id number]  PCP:  [facility] [place]  Cardiologist:  None  Referring MD: [facility]   No chief complaint on file.   History of Present Illness:    Cheryl Perez is a 27 y.o. female who is seen as a new consult at the request of [employer], [facility] for the evaluation and management of intermittent lightheadedness and malaise.  Today the patient states that she  She denies any palpitations, chest pain, shortness of breath, or peripheral edema. No lightheadedness, headaches, syncope, orthopnea, or PND.  (+)  Past Medical History:  Diagnosis Date   Migraine     No past surgical history on file.  Current Medications: Current Outpatient Medications on File Prior to Visit  Medication Sig   acetaminophen (TYLENOL) 325 MG tablet Take 650 mg by mouth every 6 (six) hours as needed for mild pain.   HYDROcodone-acetaminophen (NORCO) 7.5-325 MG tablet Take 1 tablet by mouth every 6 (six) hours as needed for moderate pain.   ibuprofen (ADVIL) 200 MG tablet Take 200 mg by mouth every 6 (six) hours as needed for fever or headache.   rizatriptan (MAXALT) 5 MG tablet Take 5 mg by mouth as needed for migraine. May repeat in 2 hours if needed   tamsulosin (FLOMAX) 0.4 MG CAPS capsule Take 1 capsule (0.4 mg total) by mouth daily after supper.   No current facility-administered medications on file prior to visit.     Allergies:   Amoxicillin, Bactrim [sulfamethoxazole-trimethoprim], Shellfish allergy, and Omnicef [cefdinir]   Social History   Tobacco Use   Smoking status: Never    Passive exposure: Never   Smokeless tobacco: Never  Vaping Use   Vaping Use: Never used  Substance Use Topics   Alcohol use: Not Currently   Drug use: Never    Family History: family history includes Diabetes in her father and mother; Heart failure in her father.  ROS:   Please see  the history of present illness.    Additional pertinent ROS otherwise unremarkable   EKGs/Labs/Other Studies Reviewed:    The following studies were reviewed today: ***  EKG:  EKG is personally reviewed.   06/13/2022:  Recent Labs: 06/01/2022: ALT 45; BUN 11; Creatinine, Ser 0.82; Hemoglobin 14.5; Platelets 226; Potassium 3.6; Sodium 138; TSH 1.956  Recent Lipid Panel No results Perez for: "CHOL", "TRIG", "HDL", "CHOLHDL", "VLDL", "LDLCALC", "LDLDIRECT"  Physical Exam:    VS:  LMP 05/19/2022     Wt Readings from Last 3 Encounters:  08/21/21 230 lb (104.3 kg)  01/04/21 230 lb (104.3 kg)  05/14/20 242 lb (109.8 kg)    GEN: Well nourished, well developed in no acute distress HEENT: Normal, moist mucous membranes NECK: No JVD CARDIAC: regular rhythm, normal S1 and S2, no rubs or gallops. No murmur. VASCULAR: Radial and DP pulses 2+ bilaterally. No carotid bruits RESPIRATORY:  Clear to auscultation without rales, wheezing or rhonchi  ABDOMEN: Soft, non-tender, non-distended MUSCULOSKELETAL:  Ambulates independently SKIN: Warm and dry, no edema NEUROLOGIC:  Alert and oriented x 3. No focal neuro deficits noted. PSYCHIATRIC:  Normal affect    ASSESSMENT:    No diagnosis Perez. PLAN:     Cardiac risk counseling and prevention recommendations: -recommend heart healthy/Mediterranean diet, with whole grains, fruits, vegetable, fish, lean meats, nuts, and olive oil. Limit salt. -recommend moderate walking, 3-5 times/week for 30-50 minutes each  session. Aim for at least 150 minutes.week. Goal should be pace of 3 miles/hours, or walking 1.5 miles in 30 minutes -recommend avoidance of tobacco products. Avoid excess alcohol. -ASCVD risk score: The ASCVD Risk score (Arnett DK, et al., 2019) failed to calculate for the following reasons:   The 2019 ASCVD risk score is only valid for ages 20 to 45    Plan for follow up: ***  Buford Dresser, MD, PhD, [facility]    Medication Adjustments/Labs and Tests Ordered: Current medicines are reviewed at length with the patient today.  Concerns regarding medicines are outlined above.  No orders of the defined types were placed in this encounter.  No orders of the defined types were placed in this encounter.   There are no Patient Instructions on file for this visit.   I,Coren O'Brien,acting as a Education administrator for [employer], MD.,have documented all relevant documentation on the behalf of Buford Dresser, MD,as directed by  Buford Dresser, MD while in the presence of Buford Dresser, MD.  ***  Signed, Buford Dresser, MD PhD 06/13/2022 7:47 AM    Cheryl Perez

## 2022-07-06 ENCOUNTER — Ambulatory Visit (INDEPENDENT_AMBULATORY_CARE_PROVIDER_SITE_OTHER): Payer: 59 | Admitting: Cardiology

## 2022-07-06 ENCOUNTER — Encounter (HOSPITAL_BASED_OUTPATIENT_CLINIC_OR_DEPARTMENT_OTHER): Payer: Self-pay | Admitting: Cardiology

## 2022-07-06 VITALS — BP 121/68 | Ht 63.0 in | Wt 246.4 lb

## 2022-07-06 DIAGNOSIS — R002 Palpitations: Secondary | ICD-10-CM

## 2022-07-06 DIAGNOSIS — R42 Dizziness and giddiness: Secondary | ICD-10-CM

## 2022-07-06 DIAGNOSIS — Z7189 Other specified counseling: Secondary | ICD-10-CM

## 2022-07-06 NOTE — Patient Instructions (Signed)
Medication Instructions:  Your physician recommends that you continue on your current medications as directed. Please refer to the Current Medication list given to you today.   Labwork: NONE   Testing/Procedures: 14 DAY ZIO  CALL OFFICE WHEN YOU WOULD LIKE TO HAVE MAILED TO YOU   Follow-Up: AS NEEDED   Any Other Special Instructions Will Be Listed Below (If Applicable). ZIO XT- Long Term Monitor Instructions  Your physician has requested you wear a ZIO patch monitor for 14 days.  This is a single patch monitor. [employer] supplies one patch monitor per enrollment. Additional stickers are not available. Please do not apply patch if you will be having a Nuclear Stress Test,  Echocardiogram, Cardiac CT, MRI, or Chest Xray during the period you would be wearing the  monitor. The patch cannot be worn during these tests. You cannot remove and re-apply the  ZIO XT patch monitor.  Your ZIO patch monitor will be mailed 3 day USPS to your address on file. It may take 3-5 days  to receive your monitor after you have been enrolled.  Once you have received your monitor, please review the enclosed instructions. Your monitor  has already been registered assigning a specific monitor serial # to you.  Billing and Patient Assistance Program Information  We have supplied [employer] with any of your insurance information on file for billing purposes. [employer] offers a sliding scale Patient Assistance Program for patients that do not have  insurance, or whose insurance does not completely cover the cost of the ZIO monitor.  You must apply for the Patient Assistance Program to qualify for this discounted rate.  To apply, please call [employer] at [phone], select option 4, select option 2, ask to apply for  Patient Assistance Program. Theodore Demark will ask your household income, and how many people  are in your household. They will quote your out-of-pocket cost based on that information.  [employer] will also be  able to set up a 56-month, interest-free payment plan if needed.  Applying the monitor   Shave hair from upper left chest.  Hold abrader disc by orange tab. Rub abrader in 40 strokes over the upper left chest as  indicated in your monitor instructions.  Clean area with 4 enclosed alcohol pads. Let dry.  Apply patch as indicated in monitor instructions. Patch will be placed under collarbone on left  side of chest with arrow pointing upward.  Rub patch adhesive wings for 2 minutes. Remove white label marked "1". Remove the white  label marked "2". Rub patch adhesive wings for 2 additional minutes.  While looking in a mirror, press and release button in center of patch. A small green light will  flash 3-4 times. This will be your only indicator that the monitor has been turned on.  Do not shower for the first 24 hours. You may shower after the first 24 hours.  Press the button if you feel a symptom. You will hear a small click. Record Date, Time and  Symptom in the Patient Logbook.  When you are ready to remove the patch, follow instructions on the last 2 pages of Patient  Logbook. Stick patch monitor onto the last page of Patient Logbook.  Place Patient Logbook in the blue and white box. Use locking tab on box and tape box closed  securely. The blue and white box has prepaid postage on it. Please place it in the mailbox as  soon as possible. Your physician should have your test results approximately  7 days after the  monitor has been mailed back to [place].  Call [facility] at [phone] if you have questions regarding  your ZIO XT patch monitor. Call them immediately if you see an orange light blinking on your  monitor.  If your monitor falls off in less than 4 days, contact our Monitor department at [phone].  If your monitor becomes loose or falls off after 4 days call [employer] at [phone] for  suggestions on securing your monitor

## 2022-07-06 NOTE — Progress Notes (Signed)
Cardiology Office Note:    Date:  07/06/2022   ID:  Cheryl Perez, DOB 11/23/1995, MRN [id number]  PCP:  [facility]  Cardiologist:  Buford Dresser, MD  Referring MD: [facility] Jerilynn Mages*   CC: New patient evaluation for lightheadedness and malaise/fatigue  History of Present Illness:    Cheryl Perez is a 27 y.o. female with a hx of hypertension, migraine, who is seen as a new consult at the request of [facility], [facility]* for the evaluation and management of lightheadedness/dizziness and fatigue.  On 06/01/2022 she presented to urgent care with concerns for fatigue and a combination room-spinning dizziness with lightheadedness. Vital signs, orthostatics, and physical exam were reassuring. EKG showed NSR at 99 bpm without ischemic changes. Given her intermittent lightheadedness, she was recommended to follow-up with cardiology as she may benefit from Holter monitoring.  Previously seen in the ED 06/24/2021 complaining of chest pain and palpitations. She was found to be intermittent tachycardic at 100 bpm. Troponins 5 >> 0. EKG showed NSR and was nonischemic. Conservative management was recommended.  For about a year she has been experiencing frequent palpitations. 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 Her palpitations continue to occur often, but she notes that she is often stressed as well. In the past week she has noted palpitations about 2 times a day, lasting for a couple minutes.   Beginning around [place], she developed new episodes of dizziness and nausea. Initially she felt very dizzy and nauseated, exacerbated with standing. Her BP was found to be in the 140s/90s which is high for her (normally closer to [id number] systolic). Generally she confirms her dizziness as "room spinning." It seems to also occur if she starts feeling too hot. Previously she was presyncopal in the shower; she denies  ever completely losing consciousness.  Of note, she reports being told that her pulse is weaker in her left arm, and it is sometimes difficult to get a blood pressure reading in that arm.  Her father has had cardiovascular issues including blood clots.  Lately she has been working on dietary changes.  She denies any chest pain, shortness of breath, or peripheral edema. No headaches, orthopnea, or PND.  Past Medical History:  Diagnosis Date   Migraine     No past surgical history on file.  Current Medications: Current Outpatient Medications on File Prior to Visit  Medication Sig   acetaminophen (TYLENOL) 325 MG tablet Take 650 mg by mouth every 6 (six) hours as needed for mild pain.   ibuprofen (ADVIL) 200 MG tablet Take 200 mg by mouth every 6 (six) hours as needed for fever or headache.   rizatriptan (MAXALT) 5 MG tablet Take 5 mg by mouth as needed for migraine. May repeat in 2 hours if needed   No current facility-administered medications on file prior to visit.     Allergies:   Amoxicillin, Bactrim [sulfamethoxazole-trimethoprim], Shellfish allergy, and Omnicef [cefdinir]   Social History   Tobacco Use   Smoking status: Never    Passive exposure: Never   Smokeless tobacco: Never  Vaping Use   Vaping Use: Never used  Substance Use Topics   Alcohol use: Not Currently   Drug use: Never    Family History: family history includes Diabetes in her father and mother; Heart failure in her father.  ROS:   Please see the history of present illness.  Additional pertinent ROS: Constitutional: Negative for chills, fever,  night sweats, unintentional weight loss  HENT: Negative for ear pain and hearing loss.   Eyes: Negative for loss of vision and eye pain.  Respiratory: Negative for cough, sputum, wheezing.   Cardiovascular: See HPI. Gastrointestinal: Negative for abdominal pain, melena, and hematochezia. Positive for nausea. Genitourinary: Negative for dysuria and  hematuria.  Musculoskeletal: Negative for falls and myalgias.  Skin: Negative for itching and rash.  Neurological: Negative for focal weakness, focal sensory changes and loss of consciousness. Positive for dizziness, stress, and presyncope. Endo/Heme/Allergies: Does not bruise/bleed easily.     EKGs/Labs/Other Studies Reviewed:    The following studies were reviewed today:  No prior cardiovascular studies available.  EKG:  EKG is personally reviewed.   07/06/2022:  NSR at 84 bpm  Recent Labs: 06/01/2022: ALT 45; BUN 11; Creatinine, Ser 0.82; Hemoglobin 14.5; Platelets 226; Potassium 3.6; Sodium 138; TSH 1.956   Recent Lipid Panel No results found for: "CHOL", "TRIG", "HDL", "CHOLHDL", "VLDL", "LDLCALC", "LDLDIRECT"  Physical Exam:    VS:  BP 121/68   Ht '5\' 3"'$  (1.6 m)   Wt 246 lb 6.4 oz (111.8 kg)   BMI 43.65 kg/m     Orthostatics: Lying 116/75, HR 91 Sitting 118/78, HR 83 Standing 127/81, HR 89 Standing 3 min 134/71, HR 98  Wt Readings from Last 3 Encounters:  07/06/22 246 lb 6.4 oz (111.8 kg)  08/21/21 230 lb (104.3 kg)  01/04/21 230 lb (104.3 kg)    GEN: Well nourished, well developed in no acute distress HEENT: Normal, moist mucous membranes NECK: No JVD CARDIAC: regular rhythm, normal S1 and S2, no rubs or gallops. No murmur. VASCULAR: Radial and DP pulses 2+ bilaterally. No carotid bruits RESPIRATORY:  Clear to auscultation without rales, wheezing or rhonchi  ABDOMEN: Soft, non-tender, non-distended MUSCULOSKELETAL:  Ambulates independently SKIN: Warm and dry, no edema NEUROLOGIC:  Alert and oriented x 3. No focal neuro deficits noted. PSYCHIATRIC:  Normal affect    ASSESSMENT:    1. Heart palpitations   2. Episodic lightheadedness   3. Cardiac risk counseling   4. Counseling on health promotion and disease prevention    PLAN:    Episodic lightheadedness -not orthostatic on exam -no syncope or high risk features -discussed that vertigo is often  associated with sensation of motion  Palpitations -discussed 14 day Zio. She would like to see what her out of pocket would be. She will contact us when she is ready and we will mail monitor  Cardiac risk counseling and prevention recommendations: -recommend heart healthy/Mediterranean diet, with whole grains, fruits, vegetable, fish, lean meats, nuts, and olive oil. Limit salt. -recommend moderate walking, 3-5 times/week for 30-50 minutes each session. Aim for at least 150 minutes.week. Goal should be pace of 3 miles/hours, or walking 1.5 miles in 30 minutes -recommend avoidance of tobacco products. Avoid excess alcohol. -ASCVD risk score: The ASCVD Risk score (Arnett DK, et al., 2019) failed to calculate for the following reasons:   The 2019 ASCVD risk score is only valid for ages 89 to 24    Plan for follow up: PRN.  Buford Dresser, MD, PhD, [facility]    Medication Adjustments/Labs and Tests Ordered: Current medicines are reviewed at length with the patient today.  Concerns regarding medicines are outlined above.   Orders Placed This Encounter  Procedures   EKG 12-Lead   No orders of the defined types were placed in this encounter.  Patient Instructions  Medication Instructions:  Your physician recommends  that you continue on your current medications as directed. Please refer to the Current Medication list given to you today.   Labwork: NONE   Testing/Procedures: 14 DAY ZIO  CALL OFFICE WHEN YOU WOULD LIKE TO HAVE MAILED TO YOU   Follow-Up: AS NEEDED   Any Other Special Instructions Will Be Listed Below (If Applicable). ZIO XT- Long Term Monitor Instructions  Your physician has requested you wear a ZIO patch monitor for 14 days.  This is a single patch monitor. [employer] supplies one patch monitor per enrollment. Additional stickers are not available. Please do not apply patch if you will be having a Nuclear Stress Test,  Echocardiogram,  Cardiac CT, MRI, or Chest Xray during the period you would be wearing the  monitor. The patch cannot be worn during these tests. You cannot remove and re-apply the  ZIO XT patch monitor.  Your ZIO patch monitor will be mailed 3 day USPS to your address on file. It may take 3-5 days  to receive your monitor after you have been enrolled.  Once you have received your monitor, please review the enclosed instructions. Your monitor  has already been registered assigning a specific monitor serial # to you.  Billing and Patient Assistance Program Information  We have supplied [employer] with any of your insurance information on file for billing purposes. [employer] offers a sliding scale Patient Assistance Program for patients that do not have  insurance, or whose insurance does not completely cover the cost of the ZIO monitor.  You must apply for the Patient Assistance Program to qualify for this discounted rate.  To apply, please call [employer] at [phone], select option 4, select option 2, ask to apply for  Patient Assistance Program. Theodore Demark will ask your household income, and how many people  are in your household. They will quote your out-of-pocket cost based on that information.  [employer] will also be able to set up a 59-month interest-free payment plan if needed.  Applying the monitor   Shave hair from upper left chest.  Hold abrader disc by orange tab. Rub abrader in 40 strokes over the upper left chest as  indicated in your monitor instructions.  Clean area with 4 enclosed alcohol pads. Let dry.  Apply patch as indicated in monitor instructions. Patch will be placed under collarbone on left  side of chest with arrow pointing upward.  Rub patch adhesive wings for 2 minutes. Remove white label marked "1". Remove the white  label marked "2". Rub patch adhesive wings for 2 additional minutes.  While looking in a mirror, press and release button in center of patch. A small green light will   flash 3-4 times. This will be your only indicator that the monitor has been turned on.  Do not shower for the first 24 hours. You may shower after the first 24 hours.  Press the button if you feel a symptom. You will hear a small click. Record Date, Time and  Symptom in the Patient Logbook.  When you are ready to remove the patch, follow instructions on the last 2 pages of Patient  Logbook. Stick patch monitor onto the last page of Patient Logbook.  Place Patient Logbook in the blue and white box. Use locking tab on box and tape box closed  securely. The blue and white box has prepaid postage on it. Please place it in the mailbox as  soon as possible. Your physician should have your test results approximately 7 days after the  monitor has been mailed back to [place].  Call [facility] at [phone] if you have questions regarding  your ZIO XT patch monitor. Call them immediately if you see an orange light blinking on your  monitor.  If your monitor falls off in less than 4 days, contact our Monitor department at [phone].  If your monitor becomes loose or falls off after 4 days call [employer] at [phone] for  suggestions on securing your monitor    I,Mathew Stumpf,acting as a scribe for Buford Dresser, MD.,have documented all relevant documentation on the behalf of Buford Dresser, MD,as directed by  Buford Dresser, MD while in the presence of Buford Dresser, MD.  I, Buford Dresser, MD, have reviewed all documentation for this visit. The documentation on 08/09/22 for the exam, diagnosis, procedures, and orders are all accurate and complete.   Signed, Buford Dresser, MD PhD 07/06/2022     [place]

## 2022-08-09 ENCOUNTER — Encounter (HOSPITAL_BASED_OUTPATIENT_CLINIC_OR_DEPARTMENT_OTHER): Payer: Self-pay | Admitting: Cardiology

## 2022-09-06 IMAGING — US US ABDOMEN LIMITED
1 series · 14 of 25 positions shown · non-contrast
Comparison: CT 05/15/2020.

CLINICAL DATA: Generalized abdominal pain, r/o gallstones.

EXAM:
ULTRASOUND ABDOMEN LIMITED RIGHT UPPER QUADRANT

[Series 1: us abdomen limited · 0.23mm/px · 14 of 40 slices shown]
[im 1/40]
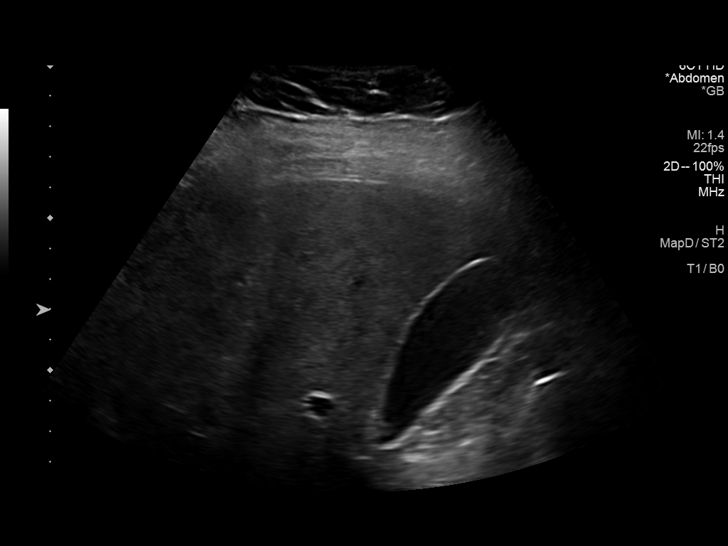
[im 4/40]
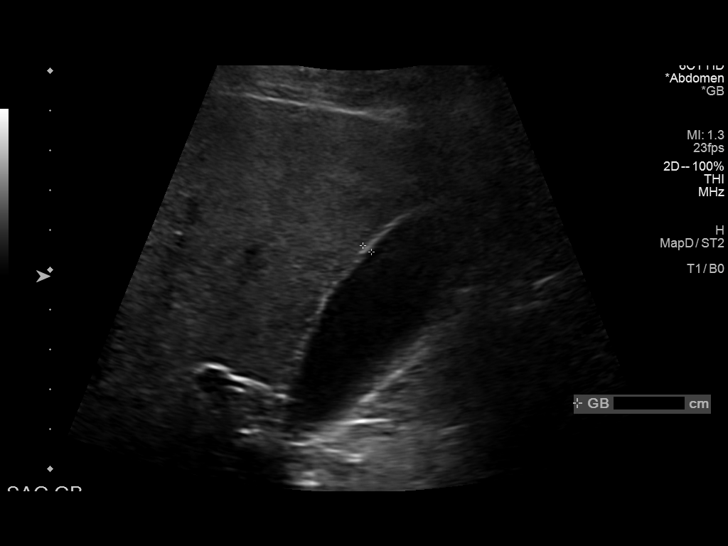
[im 7/40]
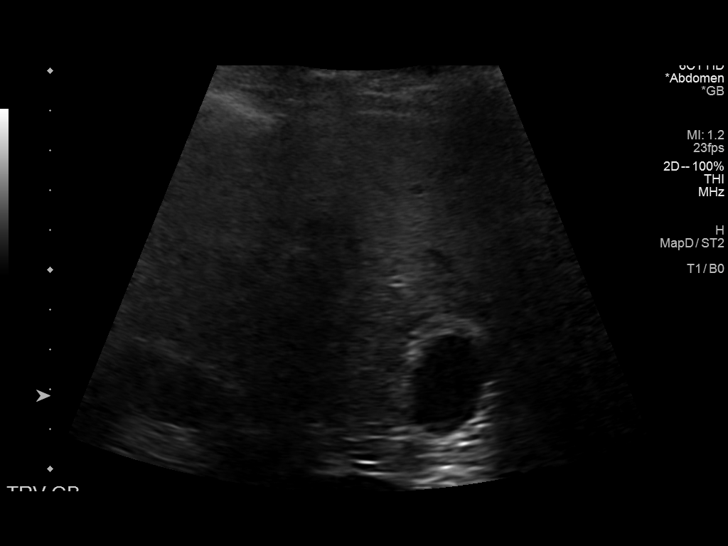
[im 10/40]
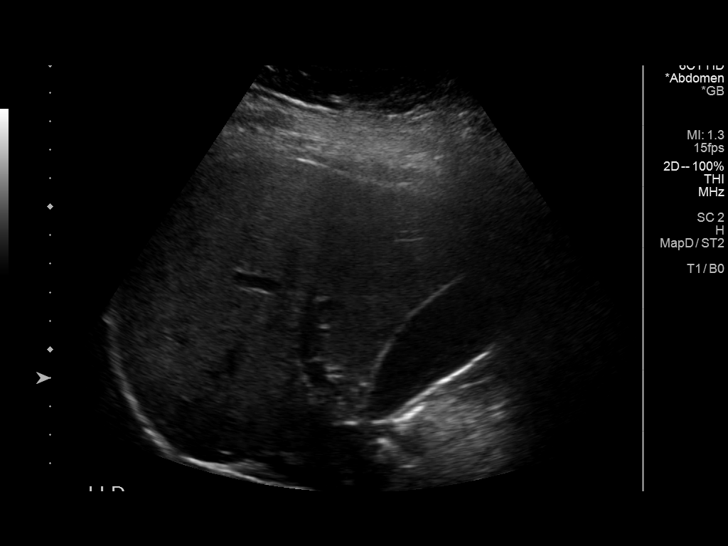
[im 14/40]
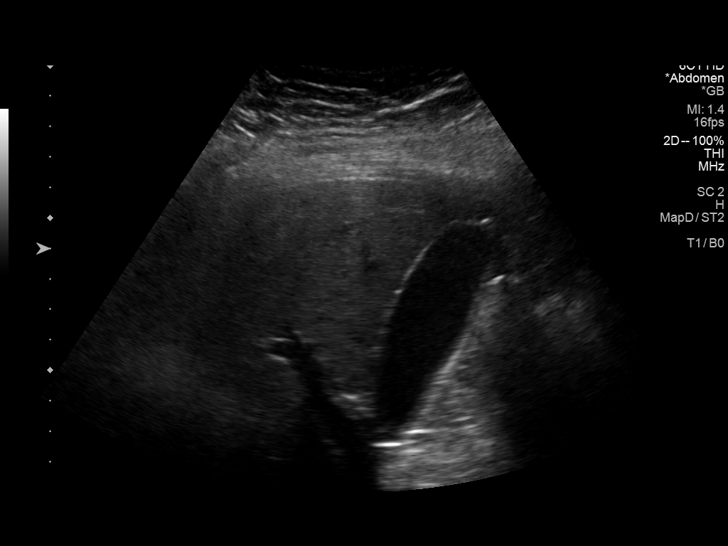
[im 15/40]
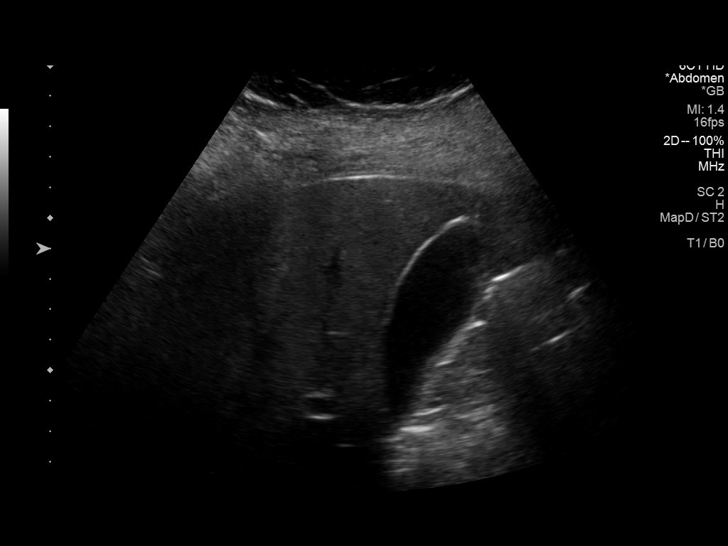
[im 18/40]
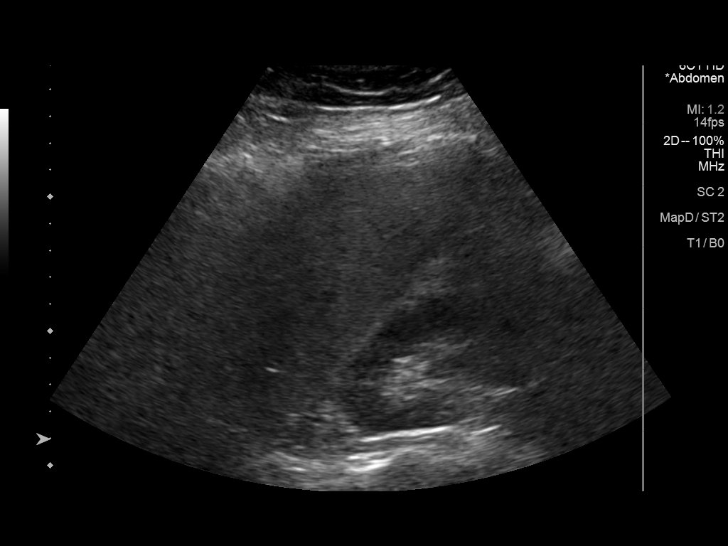
[im 22/40]
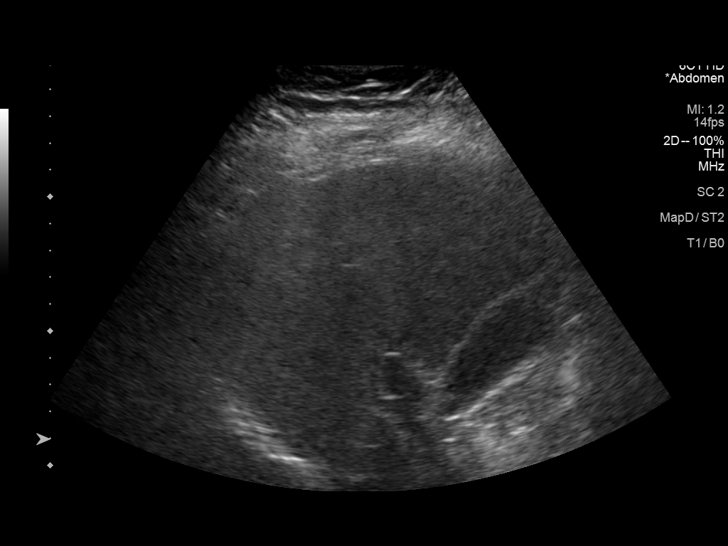
[im 25/40]
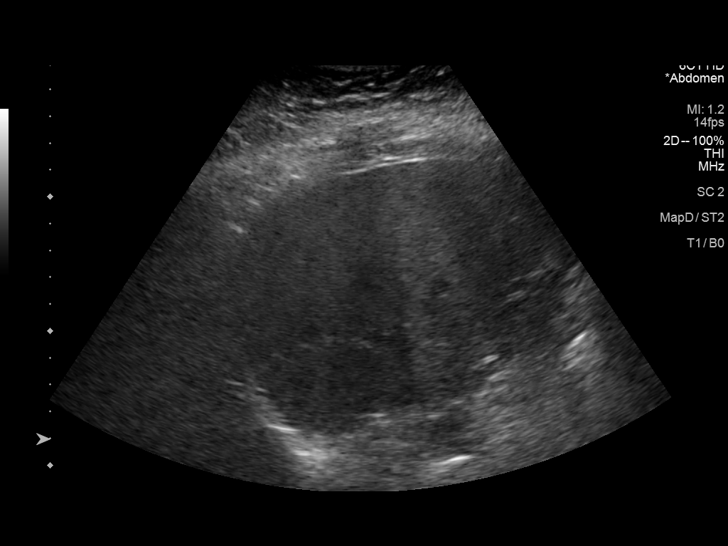
[im 27/40]
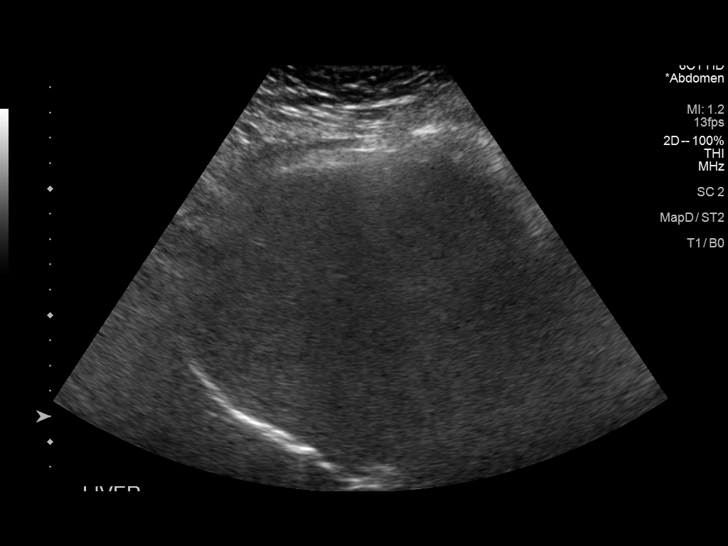
[im 30/40]
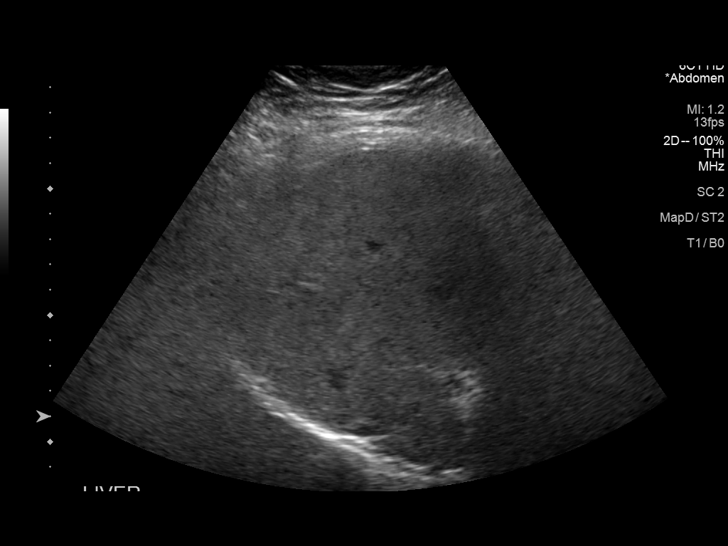
[im 33/40]
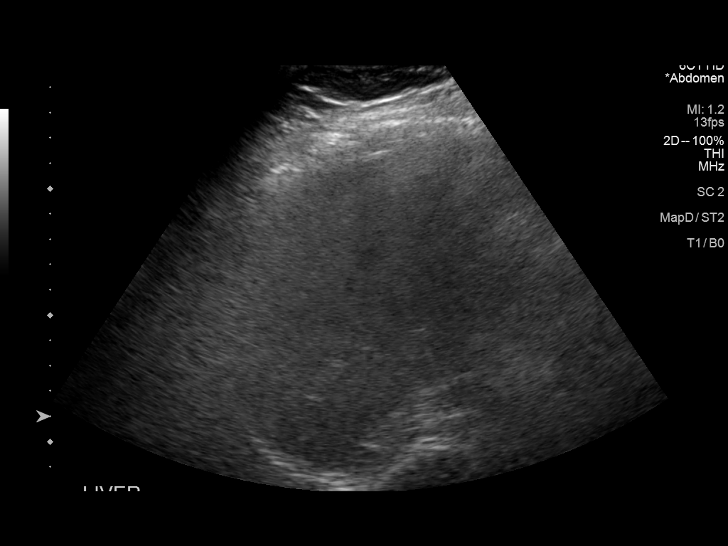
[im 36/40]
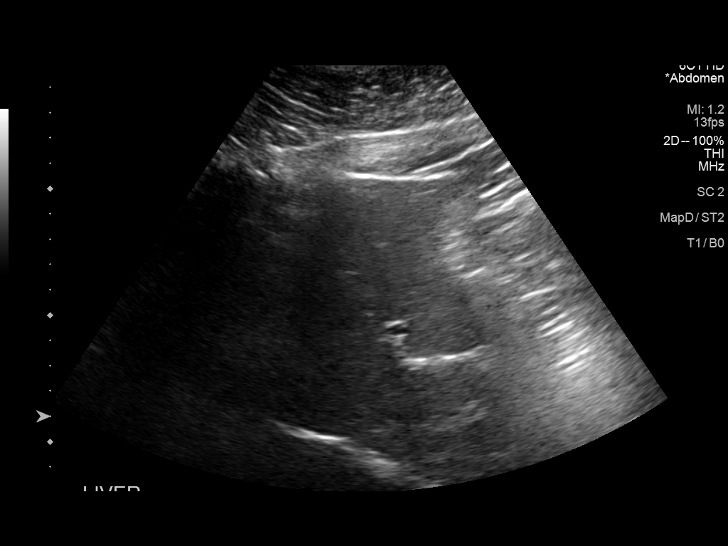
[im 40/40]
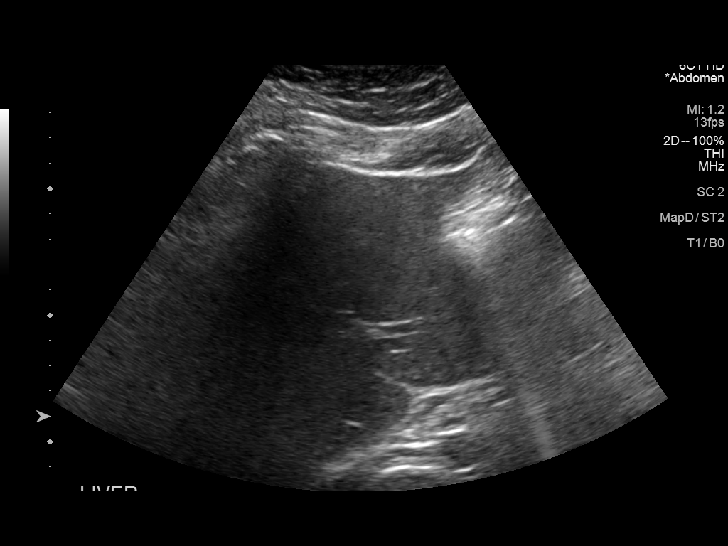

[14 of 25 positions shown; findings below may reference images not displayed]

FINDINGS: Gallbladder:

No gallstones or wall thickening visualized. No sonographic Murphy
sign noted by sonographer.

Common bile duct:

Diameter: 4.3 mm

Liver:

Heterogeneous increased echogenicity. No focal hepatic abnormality
identified. Portal vein is patent on color Doppler imaging with
normal direction of blood flow towards the liver.

Other: Exam limited secondary to patient's body habitus.
IMPRESSION: 1. No gallstones or biliary distention.
2. Heterogeneous increased hepatic echogenicity consistent with
fatty infiltration or hepatocellular disease.

## 2023-01-02 DIAGNOSIS — H60332 Swimmer's ear, left ear: Secondary | ICD-10-CM | POA: Diagnosis not present

## 2023-01-02 DIAGNOSIS — H66003 Acute suppurative otitis media without spontaneous rupture of ear drum, bilateral: Secondary | ICD-10-CM | POA: Diagnosis not present

## 2023-01-07 ENCOUNTER — Ambulatory Visit
Admission: EM | Admit: 2023-01-07 | Discharge: 2023-01-07 | Disposition: A | Discharge status: Home / Self Care | Payer: 59 | Attending: Family Medicine | Admitting: Family Medicine

## 2023-01-07 ENCOUNTER — Ambulatory Visit (INDEPENDENT_AMBULATORY_CARE_PROVIDER_SITE_OTHER): Payer: 59

## 2023-01-07 ENCOUNTER — Encounter: Payer: Self-pay | Admitting: Emergency Medicine

## 2023-01-07 DIAGNOSIS — J069 Acute upper respiratory infection, unspecified: Secondary | ICD-10-CM | POA: Diagnosis not present

## 2023-01-07 DIAGNOSIS — H9201 Otalgia, right ear: Secondary | ICD-10-CM

## 2023-01-07 DIAGNOSIS — R062 Wheezing: Secondary | ICD-10-CM

## 2023-01-07 DIAGNOSIS — R059 Cough, unspecified: Secondary | ICD-10-CM

## 2023-01-07 MED ORDER — PREDNISONE 50 MG PO TABS: ORAL_TABLET | ORAL | 0 refills | Status: DC

## 2023-01-07 MED ORDER — HYDROCOD POLI-CHLORPHE POLI ER 10-8 MG/5ML PO SUER: 5.0000 mL | Freq: Two times a day (BID) | ORAL | 0 refills | Status: DC | PRN

## 2023-01-07 MED ORDER — LEVOFLOXACIN 500 MG PO TABS: 500.0000 mg | ORAL_TABLET | Freq: Every day | ORAL | 0 refills | Status: DC

## 2023-01-07 NOTE — ED Provider Notes (Signed)
[facility]    CSN: [id number] Arrival date & time: 01/07/23  0813      History   Chief Complaint Chief Complaint  Patient presents with   Cough    HPI Cheryl Perez is a 27 y.o. female.   Pleasant 27 year old.  Generally in good health.  Here with an upper respiratory infection.  Accompanied by her mother who expresses concern.  She was seen a week ago for an ear infection.  She was given a Z-Pak.  She states that she continues to have ear pressure and pain, sinus pain, postnasal drip, sore throat, unremitting coughing.  Coughing up yellow and green sputum.  Chest tightness.  Cannot sleep because of cough.  Symptoms been going on for over a week. Expresses allergies to penicillin, cephalosporins and sulfa antibiotics No underlying asthma or allergies    Past Medical History:  Diagnosis Date   Migraine     There are no problems to display for this patient.   History reviewed. No pertinent surgical history.  OB History   No obstetric history on file.      Home Medications    Prior to Admission medications   Medication Sig Start Date End Date Taking? Authorizing Provider  chlorpheniramine-HYDROcodone (TUSSIONEX) 10-8 MG/5ML Take 5 mLs by mouth every 12 (twelve) hours as needed for cough. 01/07/23  Yes Eustace Moore, MD  levofloxacin (LEVAQUIN) 500 MG tablet Take 1 tablet (500 mg total) by mouth daily. 01/07/23  Yes Eustace Moore, MD  predniSONE (DELTASONE) 50 MG tablet Take once a day for 5 days.  Take with food 01/07/23  Yes Eustace Moore, MD  acetaminophen (TYLENOL) 325 MG tablet Take 650 mg by mouth every 6 (six) hours as needed for mild pain.    [provider]  ibuprofen (ADVIL) 200 MG tablet Take 200 mg by mouth every 6 (six) hours as needed for fever or headache.    [provider]  rizatriptan (MAXALT) 5 MG tablet Take 5 mg by mouth as needed for migraine. May repeat in 2 hours if needed    [provider]     Family History Family History  Problem Relation Age of Onset   Diabetes Mother    Heart failure Father    Diabetes Father     Social History Social History   Tobacco Use   Smoking status: Never    Passive exposure: Never   Smokeless tobacco: Never  Vaping Use   Vaping status: Never Used  Substance Use Topics   Alcohol use: Not Currently   Drug use: Never     Allergies   Amoxicillin, Bactrim [sulfamethoxazole-trimethoprim], Shellfish allergy, and Omnicef [cefdinir]   Review of Systems Review of Systems  See HPI Physical Exam Triage Vital Signs ED Triage Vitals  Encounter Vitals Group     BP 01/07/23 0828 127/74     Systolic BP Percentile --      Diastolic BP Percentile --      Pulse Rate 01/07/23 0828 98     Resp 01/07/23 0828 18     Temp 01/07/23 0828 98.4 F (36.9 C)     Temp Source 01/07/23 0828 Oral     SpO2 01/07/23 0828 96 %     Weight --      Height --      Head Circumference --      Peak Flow --      Pain Score 01/07/23 0830 0     Pain Loc --  Pain Education --      Exclude from Growth Chart --    No data found.  Updated Vital Signs BP 127/74 (BP Location: Right Arm)   Pulse 98   Temp 98.4 F (36.9 C) (Oral)   Resp 18   LMP 12/11/2022   SpO2 96%      Physical Exam Constitutional:      General: She is in acute distress.     Appearance: She is well-developed. She is obese. She is ill-appearing.     Comments: Patient with frequent coughing.  Appears tired, ill  HENT:     Head: Normocephalic and atraumatic.     Right Ear: Ear canal normal.     Left Ear: Tympanic membrane and ear canal normal.     Ears:     Comments: TM is injected    Nose: Congestion and rhinorrhea present.     Mouth/Throat:     Mouth: Mucous membranes are moist.     Pharynx: Posterior oropharyngeal erythema present.  Eyes:     Conjunctiva/sclera: Conjunctivae normal.     Pupils: Pupils are equal, round, and reactive to light.  Cardiovascular:     Rate  and Rhythm: Normal rate and regular rhythm.     Heart sounds: Normal heart sounds.  Pulmonary:     Effort: Pulmonary effort is normal. No respiratory distress.     Breath sounds: Wheezing and rhonchi present.     Comments: Patient has central rhonchi.  Few scattered wheeze Abdominal:     General: There is no distension.     Palpations: Abdomen is soft.  Musculoskeletal:        General: Normal range of motion.     Cervical back: Normal range of motion.  Lymphadenopathy:     Cervical: Cervical adenopathy present.  Skin:    General: Skin is warm and dry.  Neurological:     Mental Status: She is alert.      UC Treatments / Results  Labs (all labs ordered are listed, but only abnormal results are displayed) Labs Reviewed - No data to display  EKG   Radiology DG Chest 2 View  Result Date: 01/07/2023 CLINICAL DATA:  Cough Rhonchi Wheezes EXAM: CHEST - 2 VIEW COMPARISON:  None available. FINDINGS: Cardiomediastinal silhouette and pulmonary vasculature are within normal limits. Lungs are clear. IMPRESSION: No acute cardiopulmonary process. Electronically Signed   By: Acquanetta Belling M.D.   On: 01/07/2023 09:13    Procedures Procedures (including critical care time)  Medications Ordered in UC Medications - No data to display  Initial Impression / Assessment and Plan / UC Course  I have reviewed the triage vital signs and the nursing notes.  Pertinent labs & imaging results that were available during my care of the patient were reviewed by me and considered in my medical decision making (see chart for details).     Discussed with patient that she likely started with a viral illness.  Do not know that the Z-Pak has made a big difference.  At this point she has been sick for over a week and has worsening cough and symptoms.  Will treat her with a stronger antibiotic, prednisone for the wheezing, Tussionex so she can sleep.  She should see her PCP if not improving week Final Clinical  Impressions(s) / UC Diagnoses   Final diagnoses:  Viral URI with cough  Wheezing  Right ear pain     Discharge Instructions      Drink lots of  water Put humidifier in bedroom if available Take the antibiotic once a day Take the prednisone once a day try tussionex for cough See your pcp if not better by next week    ED Prescriptions     Medication Sig Dispense Auth. Provider   predniSONE (DELTASONE) 50 MG tablet Take once a day for 5 days.  Take with food 5 tablet Eustace Moore, MD   levofloxacin (LEVAQUIN) 500 MG tablet Take 1 tablet (500 mg total) by mouth daily. 7 tablet Eustace Moore, MD   chlorpheniramine-HYDROcodone (TUSSIONEX) 10-8 MG/5ML Take 5 mLs by mouth every 12 (twelve) hours as needed for cough. 115 mL Eustace Moore, MD      PDMP not reviewed this encounter.   Eustace Moore, MD 01/07/23 1011

## 2023-01-07 NOTE — Discharge Instructions (Signed)
Drink lots of water Put humidifier in bedroom if available Take the antibiotic once a day Take the prednisone once a day try tussionex for cough See your pcp if not better by next week

## 2023-01-07 NOTE — ED Triage Notes (Signed)
Pt states for over a week she has had cough, nasal drainage and ear pain. Seen 6 days ago and treated with zpack and ear pain resolved but other symptoms are worse. Taking OTC cold meds.

## 2023-01-19 ENCOUNTER — Encounter: Payer: Self-pay | Admitting: Emergency Medicine

## 2023-01-19 ENCOUNTER — Ambulatory Visit
Admission: EM | Admit: 2023-01-19 | Discharge: 2023-01-19 | Disposition: A | Discharge status: Home / Self Care | Payer: 59 | Source: Home / Self Care | Attending: Family Medicine | Admitting: Family Medicine

## 2023-01-19 DIAGNOSIS — R000 Tachycardia, unspecified: Secondary | ICD-10-CM | POA: Diagnosis not present

## 2023-01-19 NOTE — ED Provider Notes (Signed)
[facility]    CSN: [id number] Arrival date & time: 01/19/23  0801      History   Chief Complaint Chief Complaint  Patient presents with   Elevated HR    HPI Cheryl Perez is a 27 y.o. female.   Patient was treated here 12 days ago for respiratory infection, receiving Levaquin and prednisone.  During the past several days she has noted (and recorded) elevated heart rate when resting, and elevated when standing.  Two days ago she awoke with heart rate 95, remaining at about 100 during the day sitting.  When standing her rate increased to 120 to 140.  She experiences vague chest discomfort when rate reaches 140.  She also reports a constant mild generalized headache (different from her usual migraines) for 3 days.  She has noted intermittent increases in BP (yesterday approximately 146/96 from 6am to 2pm).  She has also had intermittent sweats recently. Review of records reveals that patient was seen in the [facility] ED on 06/24/21 for palpitations with a negative evaluation.  On 07/06/22 she was evaluated by cardiology for palpitations, light-headedness and fatigue.  Her EKG at that time was normal with NSR rate 84.  She was offered a 14 day ZIO monitor but she did not obtain because of the out-of-pocket expense.  She has a follow-up appointment with her cardiologist on 01/23/23.  The history is provided by the patient.    Past Medical History:  Diagnosis Date   Migraine     There are no problems to display for this patient.   History reviewed. No pertinent surgical history.  OB History   No obstetric history on file.      Home Medications    Prior to Admission medications   Medication Sig Start Date End Date Taking? Authorizing Provider  acetaminophen (TYLENOL) 325 MG tablet Take 650 mg by mouth every 6 (six) hours as needed for mild pain.   Yes [provider]  chlorpheniramine-HYDROcodone (TUSSIONEX) 10-8 MG/5ML Take 5 mLs by mouth every 12 (twelve) hours as  needed for cough. 01/07/23  Yes Eustace Moore, MD  ibuprofen (ADVIL) 200 MG tablet Take 200 mg by mouth every 6 (six) hours as needed for fever or headache.   Yes [provider]  levofloxacin (LEVAQUIN) 500 MG tablet Take 1 tablet (500 mg total) by mouth daily. 01/07/23  Yes Eustace Moore, MD  predniSONE (DELTASONE) 50 MG tablet Take once a day for 5 days.  Take with food 01/07/23  Yes Eustace Moore, MD  rizatriptan (MAXALT) 5 MG tablet Take 5 mg by mouth as needed for migraine. May repeat in 2 hours if needed   Yes [provider]    Family History Family History  Problem Relation Age of Onset   Diabetes Mother    Heart failure Father    Diabetes Father     Social History Social History   Tobacco Use   Smoking status: Never    Passive exposure: Never   Smokeless tobacco: Never  Vaping Use   Vaping status: Never Used  Substance Use Topics   Alcohol use: Not Currently   Drug use: Never     Allergies   Amoxicillin, Bactrim [sulfamethoxazole-trimethoprim], Shellfish allergy, and Omnicef [cefdinir]   Review of Systems Review of Systems  Constitutional:  Positive for diaphoresis and fatigue. Negative for activity change, appetite change, chills and fever.  Respiratory:  Positive for cough. Negative for shortness of breath, wheezing and stridor.  Patient notes that she has a residual productive cough that is resolving.  Cardiovascular:  Positive for chest pain and palpitations. Negative for leg swelling.  Neurological:  Positive for headaches. Negative for syncope, weakness and light-headedness.  All other systems reviewed and are negative.    Physical Exam Triage Vital Signs ED Triage Vitals  Encounter Vitals Group     BP 01/19/23 0821 112/75     Systolic BP Percentile --      Diastolic BP Percentile --      Pulse Rate 01/19/23 0821 (!) 116     Resp 01/19/23 0821 (!) 22     Temp 01/19/23 0821 98.7 F (37.1 C)     Temp Source  01/19/23 0821 Oral     SpO2 01/19/23 0821 97 %     Weight --      Height --      Head Circumference --      Peak Flow --      Pain Score 01/19/23 0822 0     Pain Loc --      Pain Education --      Exclude from Growth Chart --    No data found.   Updated Vital Signs BP 112/75 (BP Location: Right Arm)   Pulse (!) 116   Temp 98.7 F (37.1 C) (Oral)   Resp (!) 22   LMP 12/11/2022   SpO2 97%   Visual Acuity Right Eye Distance:   Left Eye Distance:   Bilateral Distance:    Right Eye Near:   Left Eye Near:    Bilateral Near:     Physical Exam Nursing notes and Vital Signs reviewed. Appearance:  Patient appears stated age, and in no acute distress.    Eyes:  Pupils are equal, round, and reactive to light and accomodation.  Extraocular movement is intact.  Conjunctivae are not inflamed   Pharynx:  Normal; moist mucous membranes  Neck:  Supple.  No adenopathy or thyrogegaly Lungs:  Clear to auscultation.  Breath sounds are equal.  Moving air well. Heart:  Regular rate and rhythm without murmurs, rubs, or gallops. Rate 102 Abdomen:  Nontender without masses or hepatosplenomegaly.  Bowel sounds are present.  No CVA or flank tenderness.  Extremities:  No edema.  Skin:  No rash present.     UC Treatments / Results  Labs (all labs ordered are listed, but only abnormal results are displayed) Labs Reviewed  CBC WITH DIFFERENTIAL/PLATELET - Abnormal; Notable for the following components:      Result Value   RBC 5.46 (*)    All other components within normal limits   Narrative:    Performed at:  [address] [place], [place]  [id number] Lab Director: Jolene Schimke MD, Phone:  [phone]  TSH   Narrative:    Performed at:  [address] [place] [address] [place], [place]  [id number] Lab Director: Jolene Schimke MD, Phone:  [phone]    EKG  Rate:  102 BPM PR:  126 msec QT:  358 msec QTcH:  466 msec QRSD:  86 msec QRS axis:  107  degrees Interpretation:  Sinus tachycardia; no acute changes  Radiology No results found.  Procedures Procedures (including critical care time)  Medications Ordered in UC Medications - No data to display  Initial Impression / Assessment and Plan / UC Course  I have reviewed the triage vital signs and the nursing notes.  Pertinent labs & imaging results that were available  during my care of the patient were reviewed by me and considered in my medical decision making (see chart for details).    Note tachycardia while standing today at 116. Today's EKG benign except sinus tachycardia (rate 102).  Her recent tachycardia may be secondary to a course of prednisone and resolving URI. If orthostatic tachycardia persists, concern for possible POTS.  Pheochromocytoma a rare possibility. CBC and TSH pending. Patient advised to monitor and keep a diary of her symptoms. Followup with scheduled cardiologist appointment 01/23/23 for further evaluation.  Final Clinical Impressions(s) / UC Diagnoses   Final diagnoses:  Tachycardia, unspecified     Discharge Instructions      Increase fluid intake. Please read attached information articles.  If symptoms become significantly worse during the night or over the weekend, proceed to the local emergency room.     ED Prescriptions   None       Lattie Haw, MD 01/21/23 1139

## 2023-01-19 NOTE — Discharge Instructions (Signed)
Increase fluid intake. Please read attached information articles.  If symptoms become significantly worse during the night or over the weekend, proceed to the local emergency room.

## 2023-01-19 NOTE — ED Triage Notes (Signed)
Patient c/o elevated HR while resting or up moving around.  Patient does have some chest pain when HR reaches 140.  Some SOB, productive cough which is resolving.  Patient has a f/u with her cardiologist on Tuesday 01/23/2023.

## 2023-01-20 LAB — CBC WITH DIFFERENTIAL/PLATELET
Basophils Absolute: 0.1 10*3/uL (ref 0.0–0.2)
Basos: 1 %
EOS (ABSOLUTE): 0.3 10*3/uL (ref 0.0–0.4)
Eos: 3 %
Hematocrit: 45.6 % (ref 34.0–46.6)
Hemoglobin: 15 g/dL (ref 11.1–15.9)
Immature Grans (Abs): 0.1 10*3/uL (ref 0.0–0.1)
Immature Granulocytes: 1 %
Lymphocytes Absolute: 2.8 10*3/uL (ref 0.7–3.1)
Lymphs: 30 %
MCH: 27.5 pg (ref 26.6–33.0)
MCHC: 32.9 g/dL (ref 31.5–35.7)
MCV: 84 fL (ref 79–97)
Monocytes Absolute: 0.8 10*3/uL (ref 0.1–0.9)
Monocytes: 8 %
Neutrophils Absolute: 5.6 10*3/uL (ref 1.4–7.0)
Neutrophils: 57 %
Platelets: 309 10*3/uL (ref 150–450)
RBC: 5.46 x10E6/uL — ABNORMAL HIGH (ref 3.77–5.28)
RDW: 12.6 % (ref 11.7–15.4)
WBC: 9.5 10*3/uL (ref 3.4–10.8)

## 2023-01-20 LAB — TSH: TSH: 2.75 u[IU]/mL (ref 0.450–4.500)

## 2023-01-22 ENCOUNTER — Ambulatory Visit (HOSPITAL_BASED_OUTPATIENT_CLINIC_OR_DEPARTMENT_OTHER): Payer: 59 | Admitting: Cardiology

## 2023-01-23 ENCOUNTER — Ambulatory Visit (INDEPENDENT_AMBULATORY_CARE_PROVIDER_SITE_OTHER): Payer: 59 | Admitting: Cardiology

## 2023-01-23 ENCOUNTER — Encounter (HOSPITAL_BASED_OUTPATIENT_CLINIC_OR_DEPARTMENT_OTHER): Payer: Self-pay | Admitting: Cardiology

## 2023-01-23 VITALS — BP 114/76 | HR 91 | Ht 63.0 in | Wt 249.3 lb

## 2023-01-23 DIAGNOSIS — R000 Tachycardia, unspecified: Secondary | ICD-10-CM | POA: Diagnosis not present

## 2023-01-23 DIAGNOSIS — R002 Palpitations: Secondary | ICD-10-CM | POA: Diagnosis not present

## 2023-01-23 DIAGNOSIS — Z7189 Other specified counseling: Secondary | ICD-10-CM

## 2023-01-23 DIAGNOSIS — R079 Chest pain, unspecified: Secondary | ICD-10-CM | POA: Diagnosis not present

## 2023-01-23 NOTE — Patient Instructions (Signed)
Medication Instructions:  Your physician recommends that you continue on your current medications as directed. Please refer to the Current Medication list given to you today.   Follow-Up: At [facility], you and your health needs are our priority.  As part of our continuing mission to provide you with exceptional heart care, we have created designated Provider Care Teams.  These Care Teams include your primary Cardiologist (physician) and Advanced Practice Providers (APPs -  Physician Assistants and Nurse Practitioners) who all work together to provide you with the care you need, when you need it.  We recommend signing up for the patient portal called "MyChart".  Sign up information is provided on this After Visit Summary.  MyChart is used to connect with patients for Virtual Visits (Telemedicine).  Patients are able to view lab/test results, encounter notes, upcoming appointments, etc.  Non-urgent messages can be sent to your provider as well.   To learn more about what you can do with MyChart, go to [email or web address].    Your next appointment:   6 months with Dr. Cristal Deer

## 2023-01-23 NOTE — Progress Notes (Signed)
Cardiology Office Note:  .    Date:  01/23/2023  ID:  Cheryl Perez, DOB 1996/03/23, MRN [id number] PCP: [facility] Providers Cardiologist:  Jodelle Red, MD     History of Present Illness: Cheryl Perez Kitchen    Cheryl Perez is a 27 y.o. female with a hx of hypertension, migraine, who is seen for follow-up today. She was initially seen 07/06/2022 as a new consult at the request of [facility] for the evaluation and management of lightheadedness/dizziness and fatigue.   On 06/01/2022 she presented to urgent care with concerns for fatigue and a combination room-spinning dizziness with lightheadedness. Vital signs, orthostatics, and physical exam were reassuring. EKG showed NSR at 99 bpm without ischemic changes. Given her intermittent lightheadedness, she was recommended to follow-up with cardiology as she may benefit from Holter monitoring.   Previously seen in the ED 06/24/2021 complaining of chest pain and palpitations. She was Perez to be intermittent tachycardic at 100 bpm. Troponins 5 >> 0. EKG showed NSR and was nonischemic. Conservative management was recommended.   Family hx: Her father has had cardiovascular issues including blood clots.  At her initial visit, she complained of frequent palpitations for a year. 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 Beginning around [place], she developed new episodes of dizziness and nausea. Initially she felt very dizzy and nauseated, exacerbated with standing. Her BP was Perez to be in the 140s/90s (normally closer to 112-118 systolic). She reported being told that her pulse is weaker in her left arm, sometimes difficult to get a blood pressure reading in that arm. Discussed 14 day Zio. She would contact us when ready.   Today, she confirms that she had presented to urgent care about three weeks ago with a viral URI. Had been treated with Z-pak  and then prednisone. Since that time she has experienced racing heart rates and palpitations with minimal activity. Her resting heart rates were tachycardic to 110s-120s, increased to 130s with getting up and increased to 145 bpm with activity such as cleaning. At times her heart rate has been as high as the 150s-160s with associated chest pains and feelings of shakiness/dizziness.  At this time her URI has largely resolved. She complains of mild sinus drainage due to allergies. Overall her symptoms are not as severe as they were last week, but she continues to have tachycardic episodes. No significant changes to her diet, normal oral intake.  She denies any shortness of breath, peripheral edema, headaches, syncope, orthopnea, or PND.  ROS:  Please see the history of present illness. ROS otherwise negative except as noted.  (+) Racing heart rates, palpitations (+) Chest pain with higher HR  Studies Reviewed: Cheryl Perez Kitchen    EKG Interpretation Date/Time:  Tuesday January 23 2023 14:22:20 EDT Ventricular Rate:  91 PR Interval:  132 QRS Duration:  82 QT Interval:  362 QTC Calculation: 445 R Axis:   66  Text Interpretation: Normal sinus rhythm Confirmed by Jodelle Red [phone]) on 01/23/2023 2:39:27 PM    Chest X-ray  01/07/2023: FINDINGS: Cardiomediastinal silhouette and pulmonary vasculature are within normal limits.   Lungs are clear.   IMPRESSION: No acute cardiopulmonary process.   Physical Exam:    VS:  BP 114/76 (BP Location: Left Arm, Patient Position: Sitting, Cuff Size: Large)   Pulse 91   Ht 5\' 3"  (1.6 m)   Wt 249 lb 4.8 oz (113.1 kg)   LMP  12/11/2022   BMI 44.16 kg/m    Wt Readings from Last 3 Encounters:  01/23/23 249 lb 4.8 oz (113.1 kg)  07/06/22 246 lb 6.4 oz (111.8 kg)  08/21/21 230 lb (104.3 kg)    GEN: Well nourished, well developed in no acute distress HEENT: Normal, moist mucous membranes NECK: No JVD CARDIAC: regular rhythm, normal S1 and S2, no rubs or  gallops. No murmur. VASCULAR: Radial and DP pulses 2+ bilaterally. No carotid bruits RESPIRATORY:  Clear to auscultation without rales, wheezing or rhonchi  ABDOMEN: Soft, non-tender, non-distended MUSCULOSKELETAL:  Ambulates independently SKIN: Warm and dry, no edema NEUROLOGIC:  Alert and oriented x 3. No focal neuro deficits noted. PSYCHIATRIC:  Normal affect   ASSESSMENT AND PLAN: .    Palpitations, fast heart rates, chest pain with these events -we have previously discussed 14 day Zio. She would like to see what her out of pocket would be. She will contact us when she is ready and we will mail monitor -reviewed red flag warning signs that need immediate medical attention   Cardiac risk counseling and prevention recommendations: -recommend heart healthy/Mediterranean diet, with whole grains, fruits, vegetable, fish, lean meats, nuts, and olive oil. Limit salt. -recommend moderate walking, 3-5 times/week for 30-50 minutes each session. Aim for at least 150 minutes.week. Goal should be pace of 3 miles/hours, or walking 1.5 miles in 30 minutes -recommend avoidance of tobacco products. Avoid excess alcohol. -ASCVD risk score: The ASCVD Risk score (Arnett DK, et al., 2019) failed to calculate for the following reasons:   The 2019 ASCVD risk score is only valid for ages 42 to 18    Dispo: Follow-up in 6 months, or sooner as needed.  I,Mathew Stumpf,acting as a Neurosurgeon for [employer], MD.,have documented all relevant documentation on the behalf of Jodelle Red, MD,as directed by  Jodelle Red, MD while in the presence of Jodelle Red, MD.  I, Jodelle Red, MD, have reviewed all documentation for this visit. The documentation on 02/25/23 for the exam, diagnosis, procedures, and orders are all accurate and complete.   Signed, Jodelle Red, MD

## 2023-04-19 IMAGING — CR DG LUMBAR SPINE COMPLETE 4+V
5 series · 5 of 5 positions shown · non-contrast
Comparison: None.

CLINICAL DATA: Back pain. Pt complains of low back pain. Reports a
hx of bulging disc at L4-L5. Reports tonight at work she took a step
forward and felt an intense, sharp pain in her lower back

EXAM:
LUMBAR SPINE - COMPLETE 4+ VIEW

[l-spine ap]
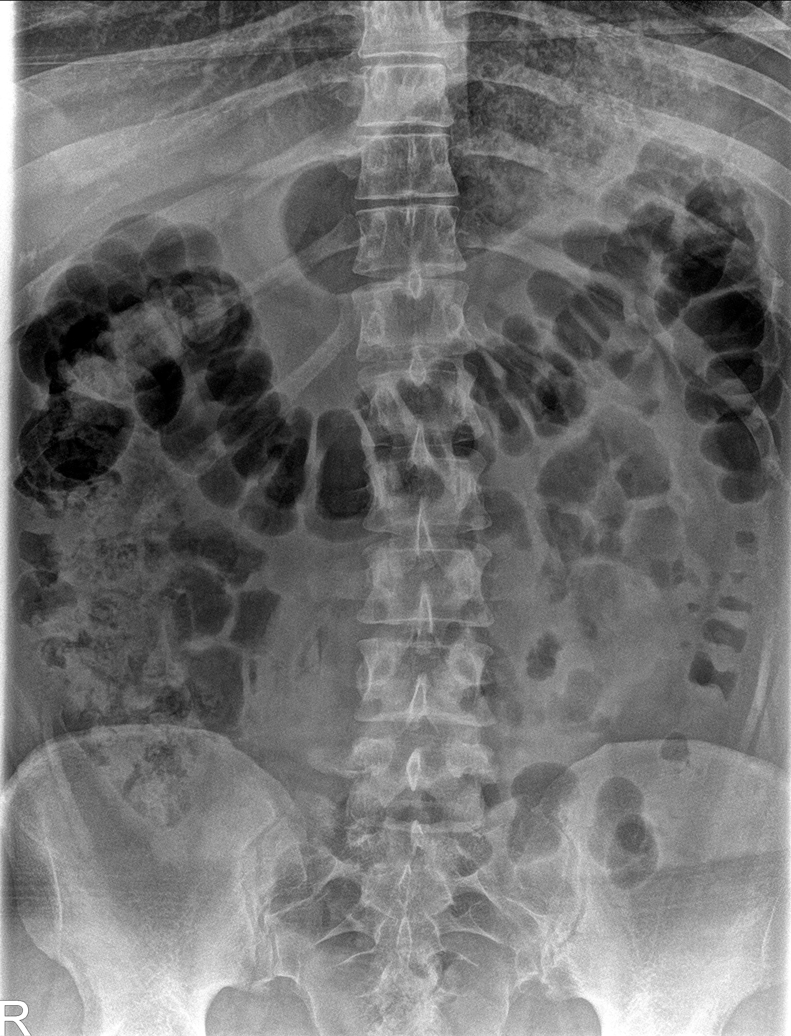

[l-spine obl (1 of 2)]
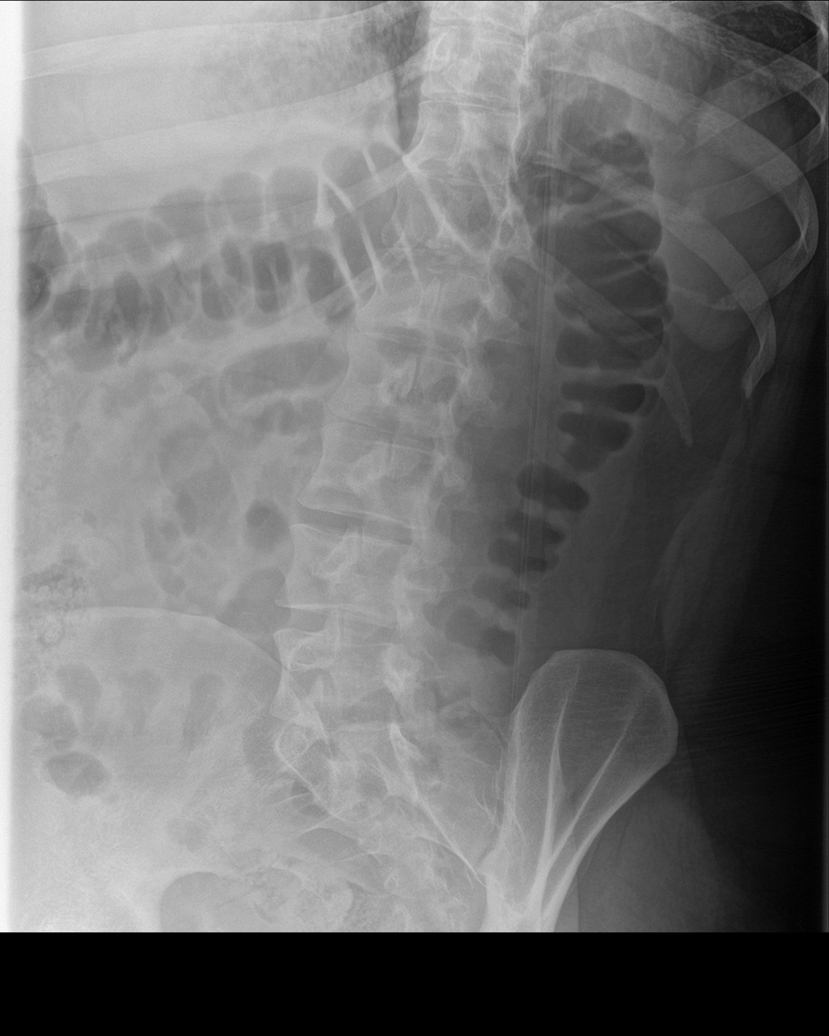

[l-spine obl (2 of 2)]
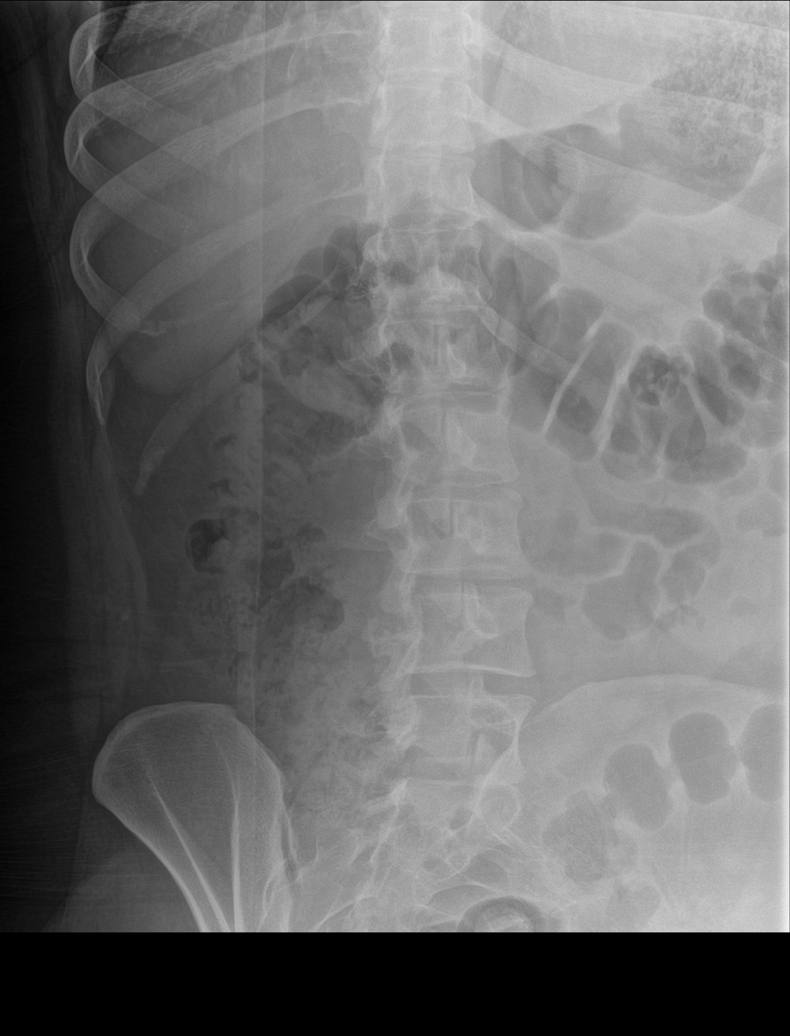

[l-spine lat]
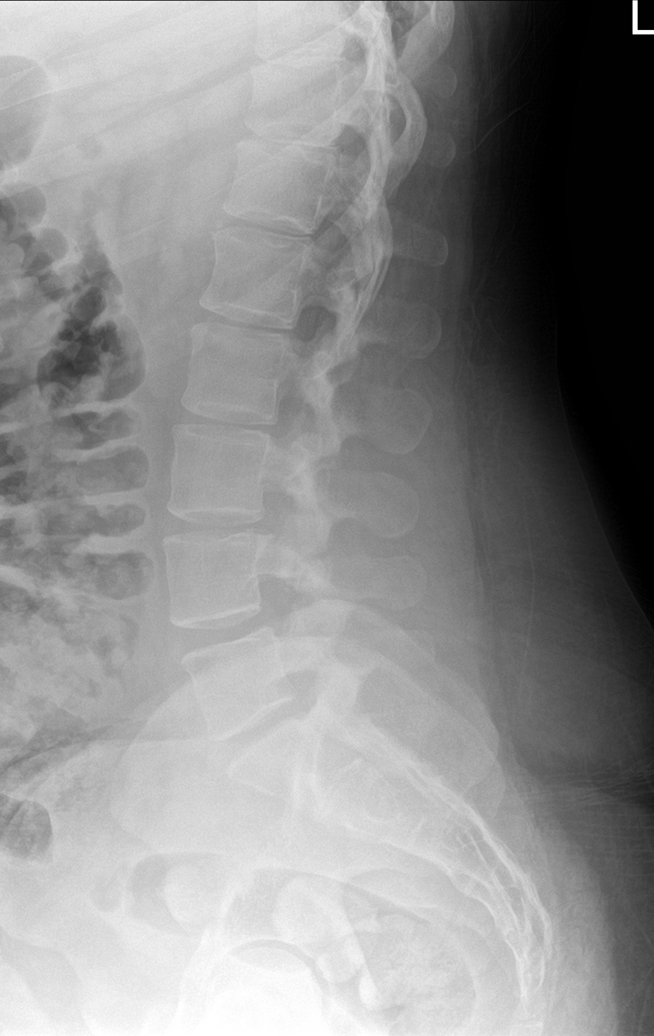

[l-spine spot]
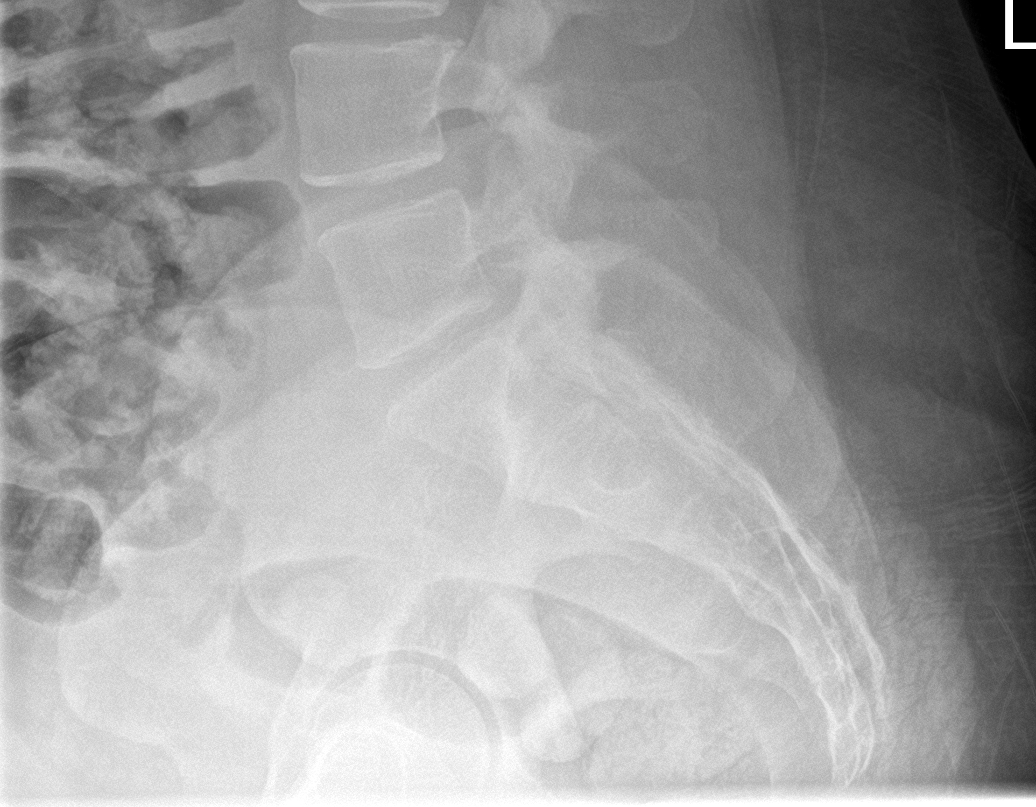

[5 of 5 positions shown; findings below may reference images not displayed]

FINDINGS: There is no evidence of lumbar spine fracture. Alignment is normal.
Intervertebral disc spaces are maintained.
IMPRESSION: Negative.

## 2023-06-10 ENCOUNTER — Ambulatory Visit
Admission: EM | Admit: 2023-06-10 | Discharge: 2023-06-10 | Disposition: A | Discharge status: Home / Self Care | Payer: 59 | Attending: Family Medicine | Admitting: Family Medicine

## 2023-06-10 DIAGNOSIS — J209 Acute bronchitis, unspecified: Secondary | ICD-10-CM

## 2023-06-10 MED ORDER — AZITHROMYCIN 250 MG PO TABS: ORAL_TABLET | ORAL | 0 refills | Status: DC

## 2023-06-10 MED ORDER — CETIRIZINE HCL 10 MG PO TABS: 10.0000 mg | ORAL_TABLET | Freq: Every day | ORAL | 0 refills | Status: DC

## 2023-06-10 MED ORDER — PROMETHAZINE-DM 6.25-15 MG/5ML PO SYRP: 5.0000 mL | ORAL_SOLUTION | Freq: Three times a day (TID) | ORAL | 0 refills | Status: DC | PRN

## 2023-06-10 MED ORDER — IBUPROFEN 600 MG PO TABS: 600.0000 mg | ORAL_TABLET | Freq: Four times a day (QID) | ORAL | 0 refills | Status: DC | PRN

## 2023-06-10 NOTE — ED Triage Notes (Signed)
 Pt c/p cough, head/chest congestion, body aches x 6 days-NAD-steady gait

## 2023-06-10 NOTE — Discharge Instructions (Signed)
 I will manage your bronchitis with azithromycin .  For sore throat or cough try using a honey-based tea. Use 3 teaspoons of honey with juice squeezed from half lemon. Place shaved pieces of ginger into 1/2-1 cup of water and warm over stove top. Then mix the ingredients and repeat every 4 hours as needed. Please take ibuprofen  600mg  every 6 hours with food alternating with OR taken together with Tylenol  500mg -650mg  every 6 hours for throat pain, fevers, aches and pains. Hydrate very well with at least 2 liters of water. Eat light meals such as soups (chicken and noodles, vegetable, chicken and wild rice).  Do not eat foods that you are allergic to.  Taking an antihistamine like Zyrtec  (10mg  daily) can help against postnasal drainage, sinus congestion which can cause sinus pain, sinus headaches, throat pain, painful swallowing, coughing.  You can take this together with cough medication as needed.

## 2023-06-10 NOTE — ED Provider Notes (Signed)
 [facility]  Note:  This document was prepared using Conservation officer, historic buildings and may include unintentional dictation errors.  MRN: [id number] DOB: [birth year]  Subjective:   Cheryl Perez is a 28 y.o. female presenting for 6 day history of acute onset sinus and chest congestion, pleuritic pain. Has had a mostly dry cough. No fever, wheezing, chest pain. Had bouts of bronchitis when she was young but no asthma. No smoking of any kind including cigarettes, cigars, vaping, marijuana use.    No current facility-administered medications for this encounter.  Current Outpatient Medications:    acetaminophen  (TYLENOL ) 325 MG tablet, Take 650 mg by mouth every 6 (six) hours as needed for mild pain., Disp: , Rfl:    ibuprofen  (ADVIL ) 200 MG tablet, Take 200 mg by mouth every 6 (six) hours as needed for fever or headache., Disp: , Rfl:    rizatriptan (MAXALT) 5 MG tablet, Take 5 mg by mouth as needed for migraine. May repeat in 2 hours if needed, Disp: , Rfl:    Allergies  Allergen Reactions   Amoxicillin Nausea And Vomiting   Bactrim [Sulfamethoxazole-Trimethoprim] Hives and Rash   Shellfish Allergy Shortness Of Breath and Swelling   Omnicef [Cefdinir] Nausea And Vomiting and Rash    Past Medical History:  Diagnosis Date   Migraine      No past surgical history on file.  Family History  Problem Relation Age of Onset   Diabetes Mother    Heart failure Father    Diabetes Father     Social History   Tobacco Use   Smoking status: Never    Passive exposure: Never   Smokeless tobacco: Never  Vaping Use   Vaping status: Never Used  Substance Use Topics   Alcohol use: Not Currently   Drug use: Never    ROS   Objective:   Vitals: BP 130/69 (BP Location: Right Arm)   Pulse (!) 112   Temp 98.9 F (37.2 C) (Oral)   Resp 20   LMP 05/21/2023   SpO2 96%   Physical Exam Constitutional:      General: She is not in acute distress.     Appearance: Normal appearance. She is well-developed and normal weight. She is not ill-appearing, toxic-appearing or diaphoretic.  HENT:     Head: Normocephalic and atraumatic.     Right Ear: Tympanic membrane, ear canal and external ear normal. No drainage or tenderness. No middle ear effusion. There is no impacted cerumen. Tympanic membrane is not erythematous or bulging.     Left Ear: Tympanic membrane, ear canal and external ear normal. No drainage or tenderness.  No middle ear effusion. There is no impacted cerumen. Tympanic membrane is not erythematous or bulging.     Nose: Nose normal. No congestion or rhinorrhea.     Mouth/Throat:     Mouth: Mucous membranes are moist. No oral lesions.     Pharynx: No pharyngeal swelling, oropharyngeal exudate, posterior oropharyngeal erythema or uvula swelling.     Tonsils: No tonsillar exudate or tonsillar abscesses.  Eyes:     General: No scleral icterus.       Right eye: No discharge.        Left eye: No discharge.     Extraocular Movements: Extraocular movements intact.     Right eye: Normal extraocular motion.     Left eye: Normal extraocular motion.     Conjunctiva/sclera: Conjunctivae normal.  Cardiovascular:     Rate and Rhythm:  Normal rate and regular rhythm.     Heart sounds: Normal heart sounds. No murmur heard.    No friction rub. No gallop.  Pulmonary:     Effort: Pulmonary effort is normal. No respiratory distress.     Breath sounds: No stridor. Rhonchi present. No wheezing or rales.  Chest:     Chest wall: No tenderness.  Musculoskeletal:     Cervical back: Normal range of motion and neck supple.  Lymphadenopathy:     Cervical: No cervical adenopathy.  Skin:    General: Skin is warm and dry.  Neurological:     General: No focal deficit present.     Mental Status: She is alert and oriented to person, place, and time.  Psychiatric:        Mood and Affect: Mood normal.        Behavior: Behavior normal.     Assessment and  Plan :   PDMP not reviewed this encounter.  1. Acute bronchitis, unspecified organism    Will defer prednisone  use given previous history of significant tachycardia with the use of this.  Recommend azithromycin  to cover for bacterial bronchitis.  Use supportive care otherwise.  Counseled patient on potential for adverse effects with medications prescribed/recommended today, ER and return-to-clinic precautions discussed, patient verbalized understanding.    Christopher Savannah, [place] 06/10/23 1523

## 2023-12-12 ENCOUNTER — Ambulatory Visit
Admission: EM | Admit: 2023-12-12 | Discharge: 2023-12-12 | Disposition: A | Discharge status: Home / Self Care | Attending: Family Medicine | Admitting: Family Medicine

## 2023-12-12 DIAGNOSIS — R1031 Right lower quadrant pain: Secondary | ICD-10-CM | POA: Diagnosis not present

## 2023-12-12 DIAGNOSIS — R102 Pelvic and perineal pain: Secondary | ICD-10-CM | POA: Diagnosis not present

## 2023-12-12 LAB — POCT URINALYSIS DIP (MANUAL ENTRY)
Bilirubin, UA: NEGATIVE
Glucose, UA: NEGATIVE mg/dL
Ketones, POC UA: NEGATIVE mg/dL
Leukocytes, UA: NEGATIVE
Nitrite, UA: NEGATIVE
Protein Ur, POC: NEGATIVE mg/dL
Spec Grav, UA: 1.02 (ref 1.010–1.025)
Urobilinogen, UA: 0.2 U/dL
pH, UA: 5.5 (ref 5.0–8.0)

## 2023-12-12 LAB — POCT URINE PREGNANCY: Preg Test, Ur: NEGATIVE

## 2023-12-12 MED ORDER — CELECOXIB 200 MG PO CAPS: 200.0000 mg | ORAL_CAPSULE | Freq: Two times a day (BID) | ORAL | 0 refills | Status: AC

## 2023-12-12 NOTE — ED Provider Notes (Signed)
 [facility]  Note:  This document was prepared using Conservation officer, historic buildings and may include unintentional dictation errors.  MRN: [id number] DOB: [birth year]  Subjective:   Cheryl Perez is a 28 y.o. female presenting for 4 day history of moderate right lower pelvic pain/abdominal pain.  Denies fever, n/v, constipation, diarrhea, bloody stools, rashes, dysuria, urinary frequency, hematuria, vaginal discharge.  No personal gynecologic history.  However her sister and mother have been diagnosed with ovarian cysts.  She does note that she had a similar episode a couple of months ago and was very similar, associated with the ovulation stage of her cycle.  Does not have a gynecologist.  No current facility-administered medications for this encounter.  Current Outpatient Medications:    acetaminophen  (TYLENOL ) 325 MG tablet, Take 650 mg by mouth every 6 (six) hours as needed for mild pain., Disp: , Rfl:    rizatriptan (MAXALT) 5 MG tablet, Take 5 mg by mouth as needed for migraine. May repeat in 2 hours if needed, Disp: , Rfl:    Allergies  Allergen Reactions   Amoxicillin Nausea And Vomiting   Bactrim [Sulfamethoxazole-Trimethoprim] Hives and Rash   Shellfish Allergy Shortness Of Breath and Swelling   Omnicef [Cefdinir] Nausea And Vomiting and Rash    Past Medical History:  Diagnosis Date   Migraine      History reviewed. No pertinent surgical history.  Family History  Problem Relation Age of Onset   Diabetes Mother    Heart failure Father    Diabetes Father     Social History   Tobacco Use   Smoking status: Never    Passive exposure: Never   Smokeless tobacco: Never  Vaping Use   Vaping status: Never Used  Substance Use Topics   Alcohol use: Not Currently   Drug use: Never    ROS   Objective:   Vitals: BP 134/76 (BP Location: Right Arm)   Pulse 99   Temp 98.9 F (37.2 C) (Oral)   Resp 16   LMP 11/22/2023   SpO2 95%    Physical Exam Constitutional:      General: She is not in acute distress.    Appearance: Normal appearance. She is well-developed. She is not ill-appearing, toxic-appearing or diaphoretic.  HENT:     Head: Normocephalic and atraumatic.     Nose: Nose normal.     Mouth/Throat:     Mouth: Mucous membranes are moist.     Pharynx: Oropharynx is clear.  Eyes:     General: No scleral icterus.       Right eye: No discharge.        Left eye: No discharge.     Extraocular Movements: Extraocular movements intact.     Conjunctiva/sclera: Conjunctivae normal.  Cardiovascular:     Rate and Rhythm: Normal rate.  Pulmonary:     Effort: Pulmonary effort is normal.  Abdominal:     General: Bowel sounds are normal. There is no distension.     Palpations: Abdomen is soft. There is no mass.     Tenderness: There is abdominal tenderness (mild right pelvic side) in the suprapubic area. There is no right CVA tenderness, left CVA tenderness, guarding or rebound.  Skin:    General: Skin is warm and dry.  Neurological:     General: No focal deficit present.     Mental Status: She is alert and oriented to person, place, and time.  Psychiatric:  Mood and Affect: Mood normal.        Behavior: Behavior normal.        Thought Content: Thought content normal.        Judgment: Judgment normal.    Results for orders placed or performed during the hospital encounter of 12/12/23 (from the past 24 hours)  POCT urine pregnancy     Status: None   Collection Time: 12/12/23  1:35 PM  Result Value Ref Range   Preg Test, Ur Negative Negative  POCT urinalysis dipstick     Status: Abnormal   Collection Time: 12/12/23  1:35 PM  Result Value Ref Range   Color, UA yellow yellow   Clarity, UA clear clear   Glucose, UA negative negative mg/dL   Bilirubin, UA negative negative   Ketones, POC UA negative negative mg/dL   Spec Grav, UA 8.979 8.989 - 1.025   Blood, UA small (A) negative   pH, UA 5.5 5.0 - 8.0    Protein Ur, POC negative negative mg/dL   Urobilinogen, UA 0.2 0.2 or 1.0 E.U./dL   Nitrite, UA Negative Negative   Leukocytes, UA Negative Negative    Assessment and Plan :   PDMP not reviewed this encounter.  1. Acute pelvic pain, female   2. Right lower quadrant abdominal pain    Patient declined STI testing.  Urine culture pending.  Recommend Celebrex  for pain, inflammation.  Suspect gynecologic source of her pain that is nonemergent such as an ovarian cyst.  Will defer ER visit.  Recommended establishing care with a gynecologist and pursuing imaging.  Counseled patient on potential for adverse effects with medications prescribed/recommended today, ER and return-to-clinic precautions discussed, patient verbalized understanding.    Christopher Savannah, [place] 12/12/23 1429

## 2023-12-12 NOTE — ED Triage Notes (Signed)
 Pt c/o intermittent right lower abd pain x couple of months and started back 4 days ago-NAD-steady gait

## 2023-12-14 ENCOUNTER — Ambulatory Visit (HOSPITAL_COMMUNITY): Payer: Self-pay

## 2023-12-14 LAB — URINE CULTURE

## 2023-12-26 ENCOUNTER — Encounter: Payer: Self-pay | Admitting: Obstetrics and Gynecology

## 2023-12-26 ENCOUNTER — Ambulatory Visit (INDEPENDENT_AMBULATORY_CARE_PROVIDER_SITE_OTHER): Admitting: Obstetrics and Gynecology

## 2023-12-26 ENCOUNTER — Other Ambulatory Visit (HOSPITAL_COMMUNITY)
Admission: RE | Admit: 2023-12-26 | Discharge: 2023-12-26 | Disposition: A | Discharge status: Home / Self Care | Source: Ambulatory Visit | Attending: Obstetrics and Gynecology | Admitting: Obstetrics and Gynecology

## 2023-12-26 VITALS — BP 115/75 | HR 82 | Ht 63.0 in | Wt 239.0 lb

## 2023-12-26 DIAGNOSIS — R102 Pelvic and perineal pain: Secondary | ICD-10-CM | POA: Diagnosis not present

## 2023-12-26 DIAGNOSIS — N910 Primary amenorrhea: Secondary | ICD-10-CM

## 2023-12-26 DIAGNOSIS — Z3202 Encounter for pregnancy test, result negative: Secondary | ICD-10-CM | POA: Diagnosis not present

## 2023-12-26 DIAGNOSIS — Z113 Encounter for screening for infections with a predominantly sexual mode of transmission: Secondary | ICD-10-CM

## 2023-12-26 DIAGNOSIS — Z124 Encounter for screening for malignant neoplasm of cervix: Secondary | ICD-10-CM

## 2023-12-26 LAB — POCT URINE PREGNANCY: Preg Test, Ur: NEGATIVE

## 2023-12-26 NOTE — Progress Notes (Signed)
 Pt is new to office, referred by urgent care- they recommend gyn f/u and possible u/s. Pt states pain still present but has decreased, mostly right sided.

## 2023-12-26 NOTE — Progress Notes (Signed)
 NEW GYNECOLOGY VISIT Chief Complaint  Patient presents with   New Patient (Initial Visit)     Subjective:  Cheryl Perez is a 28 y.o. G0P0000 who presents for pelvic pain.  Seen in Urgent care on 7/11 for pelvic pain. Provider note reviewed. UPT neg, UA within normal limits. Ucx with multiple species present  Patient notes pelvic pain since March, right sided, feels like it is her ovary. Comes/goes. Denies constipation/diarrhea. Notes nausea/vomiting from the intensity of the pain    Gyn History: Patient's last menstrual period was 11/22/2023. Sexually active: yes/no: Yes Contraception: condoms History of STIs: No Last pap: No results found for: DIAGPAP, HPV, HPVHIGH History of abnormal pap: denies Periods: regular  OB History     Gravida  0   Para  0   Term  0   Preterm  0   AB  0   Living  0      SAB  0   IAB  0   Ectopic  0   Multiple  0   Live Births  0           Past Medical History:  Diagnosis Date   Migraine     History reviewed. No pertinent surgical history.  Social History   Socioeconomic History   Marital status: Single    Spouse name: Not on file   Number of children: Not on file   Years of education: Not on file   Highest education level: Not on file  Occupational History   Not on file  Tobacco Use   Smoking status: Never    Passive exposure: Never   Smokeless tobacco: Never  Vaping Use   Vaping status: Never Used  Substance and Sexual Activity   Alcohol use: Not Currently   Drug use: Never   Sexual activity: Yes    Birth control/protection: Condom  Other Topics Concern   Not on file  Social History Narrative   Not on file   Social Drivers of Health   Financial Resource Strain: Not on file  Food Insecurity: Not on file  Transportation Needs: Not on file  Physical Activity: Not on file  Stress: Not on file  Social Connections: Unknown (10/18/2021)   Received from [facility]   Social Network     Social Network: Not on file    Family History  Problem Relation Age of Onset   Diabetes Mother    Heart failure Father    Diabetes Father     Current Outpatient Medications on File Prior to Visit  Medication Sig Dispense Refill   acetaminophen  (TYLENOL ) 325 MG tablet Take 650 mg by mouth every 6 (six) hours as needed for mild pain.     celecoxib  (CELEBREX ) 200 MG capsule Take 1 capsule (200 mg total) by mouth 2 (two) times daily. 30 capsule 0   rizatriptan (MAXALT) 5 MG tablet Take 5 mg by mouth as needed for migraine. May repeat in 2 hours if needed     No current facility-administered medications on file prior to visit.    Allergies  Allergen Reactions   Amoxicillin Nausea And Vomiting   Bactrim [Sulfamethoxazole-Trimethoprim] Hives and Rash   Shellfish Allergy Shortness Of Breath and Swelling   Omnicef [Cefdinir] Nausea And Vomiting and Rash     Objective:   Vitals:   12/26/23 1443  BP: 115/75  Pulse: 82  Weight: 239 lb (108.4 kg)  Height: 5' 3 (1.6 m)   Physical Examination:   General appearance -  well appearing, and in no distress  Mental status - alert, oriented to person, place, and time  Psych:  normal mood and affect  Skin - warm and dry, normal color, no suspicious lesions noted  Abdomen - soft, nontender, nondistended, no masses or organomegaly  Pelvic -  VULVA: normal appearing vulva with no masses, tenderness or lesions   VAGINA: normal appearing vagina with normal color and discharge, no lesions   CERVIX: normal appearing cervix without discharge or lesions, no CMT  Thin prep pap is done with reflex HR HPV cotesting  UTERUS: uterus is felt to be normal size, shape, consistency and nontender   ADNEXA: No adnexal masses or tenderness noted.  Extremities:  No swelling or varicosities noted  Chaperone present for exam  Assessment and Plan:  1. Pelvic pain (Primary) No signs of PID on clinical exam. Swab to r/o infection. Ultrasound to evaluate for  anatomic etiology. Reviewed that pelvic pain can be complex and if workup negative, consider GI evaluation. - Cervicovaginal ancillary only( [facility]) - US  PELVIC COMPLETE WITH TRANSVAGINAL; Future  2. Cervical cancer screening - Cytology - PAP( [facility])  3. Routine screening for STI (sexually transmitted infection) - HIV antibody (with reflex) - Hepatitis C Antibody - Hepatitis B Surface AntiGEN - [facility]  4. Delayed menses Repeat UPT neg   Future Appointments  Date Time Provider Department Center  01/07/2024  1:00 PM [facility]  1 [facility]  [facility]    Rollo ONEIDA Bring, MD, FACOG Obstetrician & Gynecologist, [facility] for [facility], [facility]

## 2023-12-27 ENCOUNTER — Ambulatory Visit: Payer: Self-pay | Admitting: Obstetrics and Gynecology

## 2023-12-27 LAB — HIV ANTIBODY (ROUTINE TESTING W REFLEX): HIV Screen 4th Generation wRfx: NONREACTIVE

## 2023-12-27 LAB — HEPATITIS C ANTIBODY: Hep C Virus Ab: NONREACTIVE

## 2023-12-27 LAB — CERVICOVAGINAL ANCILLARY ONLY
Chlamydia: NEGATIVE
Comment: NEGATIVE
Comment: NEGATIVE
Comment: NORMAL
Neisseria Gonorrhea: NEGATIVE
Trichomonas: NEGATIVE

## 2023-12-27 LAB — RPR: RPR Ser Ql: NONREACTIVE

## 2023-12-27 LAB — HEPATITIS B SURFACE ANTIGEN: Hepatitis B Surface Ag: NEGATIVE

## 2024-01-03 LAB — CYTOLOGY - PAP: Adequacy: ABNORMAL

## 2024-01-07 ENCOUNTER — Ambulatory Visit (HOSPITAL_COMMUNITY)
Admission: RE | Admit: 2024-01-07 | Discharge: 2024-01-07 | Disposition: A | Discharge status: Home / Self Care | Source: Ambulatory Visit | Attending: Obstetrics and Gynecology | Admitting: Obstetrics and Gynecology

## 2024-01-07 DIAGNOSIS — R102 Pelvic and perineal pain: Secondary | ICD-10-CM | POA: Insufficient documentation

## 2024-01-07 DIAGNOSIS — N888 Other specified noninflammatory disorders of cervix uteri: Secondary | ICD-10-CM | POA: Diagnosis not present

## 2024-01-14 ENCOUNTER — Ambulatory Visit: Admitting: Obstetrics

## 2024-01-17 ENCOUNTER — Telehealth: Payer: Self-pay | Admitting: *Deleted

## 2024-01-17 ENCOUNTER — Ambulatory Visit: Admitting: Obstetrics and Gynecology

## 2024-01-17 NOTE — Telephone Encounter (Signed)
 Pt called to office for u/s results. Pt made aware results not yet back, call has been placed to Radiology read room to see if results can be read and placed in chart.  Pt made aware once results reviewed provider she should hear from office.

## 2024-01-29 ENCOUNTER — Ambulatory Visit: Admitting: Obstetrics and Gynecology

## 2024-01-29 ENCOUNTER — Other Ambulatory Visit (HOSPITAL_COMMUNITY)
Admission: RE | Admit: 2024-01-29 | Discharge: 2024-01-29 | Disposition: A | Discharge status: Home / Self Care | Source: Ambulatory Visit | Attending: Obstetrics and Gynecology | Admitting: Obstetrics and Gynecology

## 2024-01-29 ENCOUNTER — Encounter: Payer: Self-pay | Admitting: Obstetrics and Gynecology

## 2024-01-29 VITALS — BP 120/74 | HR 86 | Wt 238.0 lb

## 2024-01-29 DIAGNOSIS — N940 Mittelschmerz: Secondary | ICD-10-CM | POA: Diagnosis not present

## 2024-01-29 DIAGNOSIS — R87615 Unsatisfactory cytologic smear of cervix: Secondary | ICD-10-CM

## 2024-01-29 NOTE — Progress Notes (Signed)
   ESTABLISHED GYNECOLOGY VISIT Chief Complaint  Patient presents with   REPEAT PAP/GYN    Subjective:  Cheryl Perez is a 28 y.o. G0P0000 presenting for repeat pap  Last pap insufficient  Reviewed also normal pelvic ultrasound. Patient feels pain is ovulatory in nature due to the timing. Has been on OCPs in the past- progesterone only due to hx migraines- had abnormal uterine bleeding with this and didn't like it.   Review of Systems:   Pertinent items are noted in HPI  Pertinent History Reviewed:  Reviewed past medical,surgical, social and family history.  Reviewed problem list, medications and allergies.  Objective:   Vitals:   01/29/24 1556  BP: 120/74  Pulse: 86  Weight: 238 lb (108 kg)   Physical Examination:   General appearance - well appearing, and in no distress  Mental status - alert, oriented to person, place, and time  Psych:  normal mood and affect  Skin - warm and dry, normal color, no suspicious lesions noted  Pelvic -  VULVA: normal appearing vulva with no masses, tenderness or lesions   VAGINA: normal appearing vagina with normal color and discharge, no lesions   CERVIX: normal appearing cervix without discharge or lesions, no CMT   Thin prep pap is done with reflex HR HPV cotesting  Chaperone present for exam  Assessment and Plan:  1. Encounter for repeat Pap smear due to previous insuff cervical cells (Primary) Repeat pap - Cytology - PAP( [facility])  2. Ovulatory pain Discussed ovulation suppression with OCP however given migraine history would not recommend [facility]. Reviewed supportive care   No follow-ups on file.  No future appointments.   Rollo ONEIDA Bring, MD, FACOG Obstetrician & Gynecologist, [facility] for [facility], [facility]

## 2024-01-29 NOTE — Progress Notes (Signed)
 28 y.o. GYN presents for Repeat PAP, due to insufficient sample.

## 2024-02-01 ENCOUNTER — Ambulatory Visit: Payer: Self-pay | Admitting: Obstetrics and Gynecology

## 2024-02-01 LAB — CYTOLOGY - PAP
Adequacy: ABSENT
Diagnosis: NEGATIVE

## 2024-02-06 ENCOUNTER — Encounter: Admitting: Obstetrics and Gynecology
